# Patient Record
Sex: Female | Born: 1970 | Hispanic: Yes | Marital: Married | Smoking: Never smoker
Health system: Southern US, Community
[De-identification: ages and names within clinical notes are randomized; demographics above are authoritative.]

## PROBLEM LIST (undated history)

## (undated) DIAGNOSIS — I10 Essential (primary) hypertension: Secondary | ICD-10-CM

## (undated) DIAGNOSIS — R0602 Shortness of breath: Secondary | ICD-10-CM

## (undated) DIAGNOSIS — D649 Anemia, unspecified: Secondary | ICD-10-CM

## (undated) HISTORY — PX: TUBAL LIGATION: SHX77

---

## 2006-11-06 ENCOUNTER — Emergency Department (HOSPITAL_COMMUNITY): Admission: EM | Admit: 2006-11-06 | Discharge: 2006-11-06 | Payer: Self-pay | Admitting: Emergency Medicine

## 2008-09-02 ENCOUNTER — Emergency Department (HOSPITAL_COMMUNITY): Admission: EM | Admit: 2008-09-02 | Discharge: 2008-09-02 | Payer: Self-pay | Admitting: Emergency Medicine

## 2008-09-18 ENCOUNTER — Encounter (INDEPENDENT_AMBULATORY_CARE_PROVIDER_SITE_OTHER): Payer: Self-pay | Admitting: *Deleted

## 2009-02-17 ENCOUNTER — Ambulatory Visit (HOSPITAL_COMMUNITY): Admission: RE | Admit: 2009-02-17 | Discharge: 2009-02-17 | Payer: Self-pay | Admitting: Family Medicine

## 2009-03-15 ENCOUNTER — Encounter (INDEPENDENT_AMBULATORY_CARE_PROVIDER_SITE_OTHER): Payer: Self-pay | Admitting: *Deleted

## 2009-03-15 ENCOUNTER — Telehealth (INDEPENDENT_AMBULATORY_CARE_PROVIDER_SITE_OTHER): Payer: Self-pay | Admitting: *Deleted

## 2009-04-01 ENCOUNTER — Encounter (INDEPENDENT_AMBULATORY_CARE_PROVIDER_SITE_OTHER): Payer: Self-pay | Admitting: *Deleted

## 2010-03-22 NOTE — Progress Notes (Signed)
  Phone Note Outgoing Call   Call placed by: Ricard Dillon Call placed to: [facility] Summary of Call: RECIEVED REFERRAL ON 03/12/09. DRS OFFICE STATED THIS WAS 2ND REFERALL AND PATIENT NEVER HEARD FROM [place]. PT HAD APPT ON 11/06/08 AND WAS A NO SHOW. I CALLED PT TODAY WAS TOLD SHE WAS NOT THERE AND PERSON ANSWERING PHONE WOULD NOT TAKE MESSAGE AND HUNG UP ON ME. 03/15/09 Initial call taken by: Diana Eves,  March 26, 2009 4:41 PM

## 2010-03-22 NOTE — Letter (Signed)
Summary: Appointment Reminder  [facility]  [address]   [place], [place] [address]   Phone: [phone]  Fax: [phone]       March 15, 2009   Melanie Boyd [address] [place], [place]  [address] 07-23-1970    Dear Melanie Boyd,  We have been unable to reach you by phone to schedule a follow up   appointment that was recommended for you by Dr. Darrick Penna. It is very   important that we reach you to schedule an appointment. We hope that you  allow [place] to participate in your health care needs. Please contact us at  [phone] at your earliest convenience to schedule your appointment.  Sincerely,    [facility] R. Roetta Sessions, M.D.    Kassie Mends, M.D. Lorenza Burton, FNP-BC    Tana Coast, PA-C Phone: [phone]    Fax: [phone]

## 2010-03-22 NOTE — Letter (Signed)
Summary: Generic Letter, Intro to Referring  [facility]  [address]   [place], [place] [address]   Phone: [phone]  Fax: [phone]      April 01, 2009             RE: Melanie Boyd   August 29, 1970                 [address]                 [place], [place]  [address]  Dear,Appt Scheduler    You referred this patient to our office for a consult. We have tried to reach Melanie Boyd by phone and mail. He has failed to contact us to schedule an appointment.   Sincerely,  [facility] Ph: [phone]   Fax: [phone]

## 2010-05-30 LAB — URINE MICROSCOPIC-ADD ON

## 2010-05-30 LAB — DIFFERENTIAL
Basophils Absolute: 0 10*3/uL (ref 0.0–0.1)
Basophils Relative: 0 % (ref 0–1)
Eosinophils Absolute: 0.2 10*3/uL (ref 0.0–0.7)
Monocytes Absolute: 0.7 10*3/uL (ref 0.1–1.0)
Monocytes Relative: 6 % (ref 3–12)
Neutrophils Relative %: 68 % (ref 43–77)

## 2010-05-30 LAB — URINALYSIS, ROUTINE W REFLEX MICROSCOPIC
Bilirubin Urine: NEGATIVE
Glucose, UA: NEGATIVE mg/dL
Specific Gravity, Urine: >1.03 — ABNORMAL HIGH (ref 1.005–1.030)
Urobilinogen, UA: 0.2 mg/dL (ref 0.0–1.0)
pH: 6 (ref 5.0–8.0)

## 2010-05-30 LAB — BASIC METABOLIC PANEL
CO2: 25 mEq/L (ref 19–32)
Calcium: 9.4 mg/dL (ref 8.4–10.5)
Chloride: 106 mEq/L (ref 96–112)
Creatinine, Ser: 0.64 mg/dL (ref 0.4–1.2)
GFR calc Af Amer: >60 mL/min (ref >60)
Glucose, Bld: 142 mg/dL — ABNORMAL HIGH (ref 70–99)

## 2010-05-30 LAB — URINE CULTURE: Colony Count: 50000

## 2010-05-30 LAB — CBC
MCHC: 34.8 g/dL (ref 30.0–36.0)
MCV: 76.7 fL — ABNORMAL LOW (ref 78.0–100.0)
RBC: 4.41 MIL/uL (ref 3.87–5.11)
RDW: 14.5 % (ref 11.5–15.5)

## 2010-12-03 ENCOUNTER — Encounter: Payer: Self-pay | Admitting: *Deleted

## 2010-12-03 ENCOUNTER — Inpatient Hospital Stay (HOSPITAL_COMMUNITY)
Admission: EM | Admit: 2010-12-03 | Discharge: 2010-12-05 | DRG: 203 | Disposition: A | Discharge status: Home / Self Care | Payer: 59 | Attending: Internal Medicine | Admitting: Internal Medicine

## 2010-12-03 ENCOUNTER — Emergency Department (HOSPITAL_COMMUNITY): Payer: 59

## 2010-12-03 DIAGNOSIS — J45901 Unspecified asthma with (acute) exacerbation: Secondary | ICD-10-CM

## 2010-12-03 DIAGNOSIS — J45909 Unspecified asthma, uncomplicated: Principal | ICD-10-CM | POA: Diagnosis present

## 2010-12-03 DIAGNOSIS — D649 Anemia, unspecified: Secondary | ICD-10-CM | POA: Diagnosis present

## 2010-12-03 DIAGNOSIS — R7309 Other abnormal glucose: Secondary | ICD-10-CM | POA: Diagnosis present

## 2010-12-03 DIAGNOSIS — J40 Bronchitis, not specified as acute or chronic: Secondary | ICD-10-CM

## 2010-12-03 DIAGNOSIS — J209 Acute bronchitis, unspecified: Secondary | ICD-10-CM | POA: Diagnosis present

## 2010-12-03 DIAGNOSIS — R739 Hyperglycemia, unspecified: Secondary | ICD-10-CM

## 2010-12-03 HISTORY — DX: Shortness of breath: R06.02

## 2010-12-03 LAB — CBC
MCH: 24.4 pg — ABNORMAL LOW (ref 26.0–34.0)
MCHC: 32.7 g/dL (ref 30.0–36.0)
Platelets: 266 10*3/uL (ref 150–400)

## 2010-12-03 LAB — COMPREHENSIVE METABOLIC PANEL
ALT: 12 U/L (ref 0–35)
AST: 11 U/L (ref 0–37)
Albumin: 3.8 g/dL (ref 3.5–5.2)
Calcium: 9.3 mg/dL (ref 8.4–10.5)
Potassium: 3.6 mEq/L (ref 3.5–5.1)
Sodium: 137 mEq/L (ref 135–145)
Total Protein: 7.6 g/dL (ref 6.0–8.3)

## 2010-12-03 MED ORDER — IPRATROPIUM BROMIDE 0.02 % IN SOLN
0.5000 mg | Freq: Four times a day (QID) | RESPIRATORY_TRACT | Status: DC
  Administered 2010-12-03 – 2010-12-05 (×7): 0.5 mg via RESPIRATORY_TRACT
  Filled 2010-12-03 (×7): qty 2.5

## 2010-12-03 MED ORDER — SENNA 8.6 MG PO TABS
2.0000 | ORAL_TABLET | Freq: Every day | ORAL | Status: DC | PRN
  Filled 2010-12-03: qty 2

## 2010-12-03 MED ORDER — ENOXAPARIN SODIUM 40 MG/0.4ML ~~LOC~~ SOLN
40.0000 mg | SUBCUTANEOUS | Status: DC
  Administered 2010-12-04: 40 mg via SUBCUTANEOUS
  Filled 2010-12-03: qty 0.4

## 2010-12-03 MED ORDER — IPRATROPIUM BROMIDE 0.02 % IN SOLN
0.5000 mg | Freq: Once | RESPIRATORY_TRACT | Status: AC
  Administered 2010-12-03: 0.5 mg via RESPIRATORY_TRACT
  Filled 2010-12-03: qty 2.5

## 2010-12-03 MED ORDER — SODIUM CHLORIDE 0.9 % IV SOLN
INTRAVENOUS | Status: DC
  Administered 2010-12-03: 16:00:00 via INTRAVENOUS

## 2010-12-03 MED ORDER — ALBUTEROL SULFATE (5 MG/ML) 0.5% IN NEBU
2.5000 mg | INHALATION_SOLUTION | Freq: Once | RESPIRATORY_TRACT | Status: AC
  Administered 2010-12-03: 2.5 mg via RESPIRATORY_TRACT
  Filled 2010-12-03: qty 1

## 2010-12-03 MED ORDER — SODIUM CHLORIDE 0.9 % IV BOLUS (SEPSIS)
750.0000 mL | Freq: Once | INTRAVENOUS | Status: AC
  Administered 2010-12-03: 1000 mL via INTRAVENOUS

## 2010-12-03 MED ORDER — SODIUM CHLORIDE 0.9 % IJ SOLN
INTRAMUSCULAR | Status: AC
  Filled 2010-12-03: qty 3

## 2010-12-03 MED ORDER — ACETAMINOPHEN 325 MG PO TABS
650.0000 mg | ORAL_TABLET | Freq: Four times a day (QID) | ORAL | Status: DC | PRN
  Administered 2010-12-03: 650 mg via ORAL
  Filled 2010-12-03: qty 2

## 2010-12-03 MED ORDER — ALBUTEROL SULFATE (5 MG/ML) 0.5% IN NEBU
2.5000 mg | INHALATION_SOLUTION | Freq: Four times a day (QID) | RESPIRATORY_TRACT | Status: DC
  Administered 2010-12-03 – 2010-12-05 (×8): 2.5 mg via RESPIRATORY_TRACT
  Filled 2010-12-03 (×8): qty 0.5

## 2010-12-03 MED ORDER — ALBUTEROL SULFATE (5 MG/ML) 0.5% IN NEBU
2.5000 mg | INHALATION_SOLUTION | Freq: Once | RESPIRATORY_TRACT | Status: AC
  Administered 2010-12-03: 2.5 mg via RESPIRATORY_TRACT

## 2010-12-03 MED ORDER — DEXTROSE 5 % IV SOLN
1.0000 g | Freq: Once | INTRAVENOUS | Status: AC
  Administered 2010-12-03: 1 g via INTRAVENOUS
  Filled 2010-12-03: qty 1

## 2010-12-03 MED ORDER — ZOLPIDEM TARTRATE 5 MG PO TABS: 5.0000 mg | ORAL_TABLET | Freq: Every evening | ORAL | Status: DC | PRN

## 2010-12-03 MED ORDER — OXYCODONE HCL 5 MG PO TABS
5.0000 mg | ORAL_TABLET | ORAL | Status: DC | PRN
  Administered 2010-12-04 – 2010-12-05 (×4): 5 mg via ORAL
  Filled 2010-12-03 (×4): qty 1

## 2010-12-03 MED ORDER — ALBUTEROL SULFATE (5 MG/ML) 0.5% IN NEBU: 2.5000 mg | INHALATION_SOLUTION | RESPIRATORY_TRACT | Status: DC | PRN

## 2010-12-03 MED ORDER — ALBUTEROL (5 MG/ML) CONTINUOUS INHALATION SOLN
15.0000 mg/h | INHALATION_SOLUTION | RESPIRATORY_TRACT | Status: DC
  Administered 2010-12-03: 15 mg/h via RESPIRATORY_TRACT

## 2010-12-03 MED ORDER — ALUM & MAG HYDROXIDE-SIMETH 200-200-20 MG/5ML PO SUSP: 30.0000 mL | Freq: Four times a day (QID) | ORAL | Status: DC | PRN

## 2010-12-03 MED ORDER — SODIUM CHLORIDE 0.9 % IJ SOLN
3.0000 mL | INTRAMUSCULAR | Status: DC | PRN
  Administered 2010-12-05: 3 mL via INTRAVENOUS
  Filled 2010-12-03 (×3): qty 3

## 2010-12-03 MED ORDER — FLUTICASONE PROPIONATE HFA 220 MCG/ACT IN AERO
2.0000 | INHALATION_SPRAY | Freq: Two times a day (BID) | RESPIRATORY_TRACT | Status: DC
  Administered 2010-12-04 – 2010-12-05 (×2): 2 via RESPIRATORY_TRACT
  Filled 2010-12-03: qty 12

## 2010-12-03 MED ORDER — ONDANSETRON HCL 4 MG PO TABS: 4.0000 mg | ORAL_TABLET | Freq: Four times a day (QID) | ORAL | Status: DC | PRN

## 2010-12-03 MED ORDER — ACETAMINOPHEN 650 MG RE SUPP: 650.0000 mg | Freq: Four times a day (QID) | RECTAL | Status: DC | PRN

## 2010-12-03 MED ORDER — PREDNISONE 20 MG PO TABS
30.0000 mg | ORAL_TABLET | Freq: Two times a day (BID) | ORAL | Status: DC
  Filled 2010-12-03: qty 1

## 2010-12-03 MED ORDER — PREDNISONE 20 MG PO TABS
60.0000 mg | ORAL_TABLET | Freq: Once | ORAL | Status: AC
  Administered 2010-12-03: 60 mg via ORAL
  Filled 2010-12-03: qty 3

## 2010-12-03 MED ORDER — ONDANSETRON HCL 4 MG/2ML IJ SOLN: 4.0000 mg | Freq: Four times a day (QID) | INTRAMUSCULAR | Status: DC | PRN

## 2010-12-03 MED ORDER — ALBUTEROL SULFATE (5 MG/ML) 0.5% IN NEBU
2.5000 mg | INHALATION_SOLUTION | Freq: Once | RESPIRATORY_TRACT | Status: AC
  Administered 2010-12-03: 2.5 mg via RESPIRATORY_TRACT
  Filled 2010-12-03: qty 0.5

## 2010-12-03 NOTE — ED Provider Notes (Signed)
Results for orders placed during the hospital encounter of 12/03/10  CBC      Component Value Range   WBC 10.0  4.0 - 10.5 (K/uL)   RBC 4.63  3.87 - 5.11 (MIL/uL)   Hemoglobin 11.3 (*) 12.0 - 15.0 (g/dL)   HCT 16.1 (*) 09.6 - 46.0 (%)   MCV 74.7 (*) 78.0 - 100.0 (fL)   MCH 24.4 (*) 26.0 - 34.0 (pg)   MCHC 32.7  30.0 - 36.0 (g/dL)   RDW 04.5  40.9 - 81.1 (%)   Platelets 266  150 - 400 (K/uL)  COMPREHENSIVE METABOLIC PANEL      Component Value Range   Sodium 137  135 - 145 (mEq/L)   Potassium 3.6  3.5 - 5.1 (mEq/L)   Chloride 101  96 - 112 (mEq/L)   CO2 25  19 - 32 (mEq/L)   Glucose, Bld 131 (*) 70 - 99 (mg/dL)   BUN 7  6 - 23 (mg/dL)   Creatinine, Ser <9.14 (*) 0.50 - 1.10 (mg/dL)   Calcium 9.3  8.4 - 78.2 (mg/dL)   Total Protein 7.6  6.0 - 8.3 (g/dL)   Albumin 3.8  3.5 - 5.2 (g/dL)   AST 11  0 - 37 (U/L)   ALT 12  0 - 35 (U/L)   Alkaline Phosphatase 70  39 - 117 (U/L)   Total Bilirubin 0.2 (*) 0.3 - 1.2 (mg/dL)   GFR calc non Af Amer NOT CALCULATED  >90 (mL/min)   GFR calc Af Amer NOT CALCULATED  >90 (mL/min)   Dg Chest 2 View  12/03/2010  *RADIOLOGY REPORT*  Clinical Data: Shortness of breath  CHEST - 2 VIEW  Comparison: 02/17/2009  Findings:  Mild to moderate cardiac enlargement, stable.  No pleural effusion or pulmonary edema.  No airspace consolidation identified.  IMPRESSION:  1.  No active disease. 2.  Cardiac enlargement.  Original Report Authenticated By: Rosealee Albee, M.D.   Devoria Albe, MD, Armando Gang    Ward Givens, MD 12/03/10 [phone]

## 2010-12-03 NOTE — ED Provider Notes (Signed)
History     CSN: [id number] Arrival date & time: 12/03/2010 10:56 AM  Chief Complaint  Patient presents with  . Shortness of Breath    (Consider location/radiation/quality/duration/timing/severity/associated sxs/prior treatment) HPI  Patient states Thursday 2 days ago she started having some sharp pain in her left central chest in lost her voice and had a sore throat yesterday she started coughing with yellow mucus production. She states she has also started wheezing and had some shortness of breath. She states she's never had this before except the last year after she got her flu shot she did have to come to the emergency department at 1 breathing treatment. She denies nausea or but did have one episode of posttussive vomiting today she denies diarrhea. She has had clear watery rhinorrhea. She relates she has dyspnea on exertion. Nothing she does makes it feel better.   History reviewed. No pertinent past medical history.  Past Surgical History  Procedure Date  . Tubal ligation     Family history significant for reactive airway disease in 2 siblings.  History  Substance Use Topics  . Smoking status: Never Smoker   . Smokeless tobacco: Not on file  . Alcohol Use: No   patient lives with spouse. 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  OB History    Grav Para Term Preterm Abortions TAB SAB Ect Mult Living                  Review of Systems  All other systems reviewed and are negative.    Allergies  Review of patient's allergies indicates no known allergies.  Home Medications  No current outpatient prescriptions on file.  BP 110/74  Pulse 79  Temp(Src) 98.2 F (36.8 C) (Oral)  Resp 24  Ht 5\' 1"  (1.549 m)  Wt 200 lb (90.719 kg)  BMI 37.79 kg/m2  SpO2 96%  LMP 11/07/2010  Vital signs are normal. Physical Exam  Vitals reviewed. Constitutional: She appears well-developed and well-nourished.  HENT:  Head: Normocephalic and atraumatic.    Mouth/Throat: Uvula is midline, oropharynx is clear and moist and mucous membranes are normal.  Eyes: Conjunctivae and EOM are normal. Pupils are equal, round, and reactive to light.  Cardiovascular: Normal rate, regular rhythm and normal heart sounds.   Pulmonary/Chest:       Patient is noted to have mild retractions she appears to be tachypneic she has diffuse expiratory wheezes and rhonchi. At times she has audible wheezing.  Musculoskeletal: Normal range of motion.  Neurological: She has normal strength. No cranial nerve deficit or sensory deficit.  Skin: Skin is warm, dry and intact.  Psychiatric: Her speech is normal and behavior is normal. Her affect is blunt.    ED Course  Procedures (including critical care time)  Labs Reviewed - No data to display Dg Chest 2 View  12/03/2010  *RADIOLOGY REPORT*  Clinical Data: Shortness of breath  CHEST - 2 VIEW  Comparison: 02/17/2009  Findings:  Mild to moderate cardiac enlargement, stable.  No pleural effusion or pulmonary edema.  No airspace consolidation identified.  IMPRESSION:  1.  No active disease. 2.  Cardiac enlargement.  Original Report Authenticated By: Rosealee Albee, M.D.   12:50 Patient received albuterol Atrovent nebulizer she states she's feeling better. She now has diffuse wheezing and rhonchi that has improved air movement. We will repeat her nebulizer.  13:40 After her second nebulizer she was feeling better but states she is getting worse again. Pt  has diffuse wheezing and has some tachypnea. Will try continuous nebulizer and start on oral steroids.   15:09 Checked after her continuous nebulizer. States she feels better however when she starts talking she becomes tachypneic. Has diffuse expiratory wheezing but improved. Have discussed she needs to be admitted and she is agreeable.   1617 IV started, patient started on IV antibiotics. Waiting for labs to get admitted.   16:34 Dr. Jordan Hawks he is going to admit to try 18 to  MedSurg bed   CRITICAL CARE Performed by: Makailah Slavick L   Total critical care time: 32  Critical care time was exclusive of separately billable procedures and treating other patients.  Critical care was necessary to treat or prevent imminent or life-threatening deterioration.  Critical care was time spent personally by me on the following activities: development of treatment plan with patient and/or surrogate as well as nursing, discussions with consultants, evaluation of patient's response to treatment, examination of patient, obtaining history from patient or surrogate, ordering and performing treatments and interventions, ordering and review of laboratory studies, ordering and review of radiographic studies, pulse oximetry and re-evaluation of patient's condition.  Devoria Albe, MD, FACEP  MDM          Ward Givens, MD 12/03/10 [phone]

## 2010-12-03 NOTE — ED Notes (Signed)
Pt c/o difficulty breathing, cough, congestion, yellow phlegm, chest pain with cough, and wheezing. Vomited x 1 this am.

## 2010-12-03 NOTE — H&P (Addendum)
PCP:   Dr. Phillips Odor   Chief Complaint:  Cough and shortness of breath  HPI:  40 year old female with no significant past medical history presenting to the emergency department due to shortness of breath and cough for the last 2 days. The patient has no history of asthma however reports that every time she gets the flu shot she develops cough and shortness of breath. The first time was last year when she had the flu shot and subsequently developed upper respiratory symptoms for 9 days. She did not require hospitalization. This time she received the influenza vaccine on October 3 and develop cough and shortness of breath 2 days ago. She had to miss work today. She has not been eating for the last 36 hours. She has been drinking plenty of fluids. She denies fever chills or night sweats. She has no history of tuberculosis or TB exposure. [place] is [place] There are no sick contacts at home. In the emergency department she received oxygen, prednisone 60 mg, and continuous nebulization with albuterol with partial improvement. She still feels very tight and continues to cough. She has no sputum production.   Allergies:  No Known Allergies   History reviewed. No pertinent past medical history.  Past Surgical History  Procedure Date  . Tubal ligation     Prior to Admission medications   Medication Sig Start Date End Date Taking? Authorizing Provider  ibuprofen (ADVIL,MOTRIN) 200 MG tablet Take 200-400 mg by mouth every 6 (six) hours as needed. For headaches    Yes Historical Provider, MD    Social History:  reports that she has never smoked. She does not have any smokeless tobacco history on file. She reports that she does not drink alcohol or use illicit drugs.the patient came to the [place] 27 years ago. 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 She is married and has 4 children all of whom  are healthy.   Family History  Problem Relation Age of Onset  . Asthma Sister   . Hypertension Mother   . Asthma Sister     Review of Systems:  Constitutional: Denies fever, chills, diaphoresis, appetite change and fatigue.  HEENT: Denies photophobia, eye pain, redness, hearing loss, ear pain, congestion, sore throat, rhinorrhea, sneezing, mouth sores, trouble swallowing, neck pain, neck stiffness and tinnitus.   Respiratory:  nonproductive cough and shortness of breath   Cardiovascular:  retrosternal chest pain with deep inspiration denies palpitations, edema or syncope  Gastrointestinal: Denies nausea, vomiting, abdominal pain, diarrhea, constipation, blood in stool and abdominal distention.  Genitourinary: Denies dysuria, urgency, frequency, hematuria, flank pain and difficulty urinating.  last menstrual period 11/05/2010  Musculoskeletal: Denies myalgias, back pain, joint swelling, arthralgias and gait problem.  Skin: Denies pallor, rash and wound.  Neurological: Denies dizziness, seizures, syncope, weakness, light-headedness, numbness   Physical Exam: Blood pressure 110/74, pulse 79, temperature 98.2 F (36.8 C), temperature source Oral, resp. rate 24, height 5\' 1"  (1.549 m), weight 90.719 kg (200 lb), last menstrual period 11/07/2010, SpO2 96.00%.  Constitutional: Vital signs reviewed.  Patient is a well-developed and well-nourished, overweight hispanic woman in no acute distress and cooperative with exam. Alert and oriented x3.  Head: Normocephalic and atraumatic Ear: TM normal bilaterally Mouth: no erythema or exudates, MMM Eyes: PERRL, EOMI, conjunctivae normal, No scleral icterus.  Neck: Supple, Trachea midline normal ROM, No JVD, mass, thyromegaly, or carotid  bruit present.  Cardiovascular: RRR, S1 normal, S2 normal, no MRG, pulses symmetric and intact bilaterally Pulmonary/Chest: symmetric, no use of accesory muscles, distant expiratory wheezes, a few ronchi, no  rales Abdominal: Soft. Non-tender, non-distended, bowel sounds are normal, no masses, organomegaly, or guarding present. Neurological: A&O x3, Strenght is normal and symmetric bilaterally, cranial nerve II-XII are grossly intact, no focal motor deficit, sensory intact to light touch bilaterally.  Skin: Warm, dry and intact. No rash, cyanosis, or clubbing.    Labs on Admission:  Results for orders placed during the hospital encounter of 12/03/10 (from the past 48 hour(s))  CBC     Status: Abnormal   Collection Time   12/03/10  3:23 PM      Component Value Range Comment   WBC 10.0  4.0 - 10.5 (K/uL)    RBC 4.63  3.87 - 5.11 (MIL/uL)    Hemoglobin 11.3 (*) 12.0 - 15.0 (g/dL)    HCT 62.1 (*) 30.8 - 46.0 (%)    MCV 74.7 (*) 78.0 - 100.0 (fL)    MCH 24.4 (*) 26.0 - 34.0 (pg)    MCHC 32.7  30.0 - 36.0 (g/dL)    RDW 65.7  84.6 - 96.2 (%)    Platelets 266  150 - 400 (K/uL)   COMPREHENSIVE METABOLIC PANEL     Status: Abnormal   Collection Time   12/03/10  3:23 PM      Component Value Range Comment   Sodium 137  135 - 145 (mEq/L)    Potassium 3.6  3.5 - 5.1 (mEq/L)    Chloride 101  96 - 112 (mEq/L)    CO2 25  19 - 32 (mEq/L)    Glucose, Bld 131 (*) 70 - 99 (mg/dL)    BUN 7  6 - 23 (mg/dL)    Creatinine, Ser <9.52 (*) 0.50 - 1.10 (mg/dL)    Calcium 9.3  8.4 - 10.5 (mg/dL)    Total Protein 7.6  6.0 - 8.3 (g/dL)    Albumin 3.8  3.5 - 5.2 (g/dL)    AST 11  0 - 37 (U/L)    ALT 12  0 - 35 (U/L)    Alkaline Phosphatase 70  39 - 117 (U/L)    Total Bilirubin 0.2 (*) 0.3 - 1.2 (mg/dL)    GFR calc non Af Amer NOT CALCULATED  >90 (mL/min)    GFR calc Af Amer NOT CALCULATED  >90 (mL/min)     Radiological Exams on Admission: Chest x-ray: IMPRESSION:  1. No active disease.  2. Cardiac enlargement.   Assessment/Plan Active Problems:  Asthma attack. Since the patient has responded only partially to treatment given emergent department, she will be admitted to MedSurg. To continue albuterol and  Atrovent along with prednisone 60 mg a day and oxygen supplementation. In addition she will be initiated on inhaled corticosteroid with Flovent 220 mcg twice a day. Discontinue Rocephin  Anemia. She has a microcytic anemia with no active bleeding which is mild. This is to be addressed as an outpatient. Patient was informed of this finding and advised to follow up with her regular physician  Hyperglycemia. Hyperglycemia which is mild and postprandial. Again, this is to be addressed as an outpatient and I have told patient to undergo screening for diabetes as an outpatient when she is completely off systemic corticosteroids   Time Spent on Admission: 30 minutes  Jonny Ruiz 12/03/2010, 5:05 PM

## 2010-12-04 MED ORDER — AZITHROMYCIN 250 MG PO TABS
250.0000 mg | ORAL_TABLET | Freq: Every day | ORAL | Status: DC
  Administered 2010-12-05: 250 mg via ORAL
  Filled 2010-12-04: qty 1

## 2010-12-04 MED ORDER — AZITHROMYCIN 250 MG PO TABS
500.0000 mg | ORAL_TABLET | Freq: Every day | ORAL | Status: AC
  Administered 2010-12-04: 500 mg via ORAL
  Filled 2010-12-04: qty 2

## 2010-12-04 MED ORDER — GUAIFENESIN-DM 100-10 MG/5ML PO SYRP
5.0000 mL | ORAL_SOLUTION | ORAL | Status: DC | PRN
  Administered 2010-12-04 (×2): 5 mL via ORAL
  Filled 2010-12-04 (×2): qty 5

## 2010-12-04 MED ORDER — GUAIFENESIN ER 600 MG PO TB12
600.0000 mg | ORAL_TABLET | Freq: Two times a day (BID) | ORAL | Status: DC
  Administered 2010-12-04 – 2010-12-05 (×5): 600 mg via ORAL
  Filled 2010-12-04 (×3): qty 1

## 2010-12-04 MED ORDER — METHYLPREDNISOLONE SODIUM SUCC 40 MG IJ SOLR
60.0000 mg | Freq: Four times a day (QID) | INTRAMUSCULAR | Status: DC
  Administered 2010-12-04 – 2010-12-05 (×5): 60 mg via INTRAVENOUS
  Filled 2010-12-04: qty 1
  Filled 2010-12-04 (×4): qty 2

## 2010-12-04 NOTE — Progress Notes (Signed)
Pt needs to go home with an albuterol inhaler with spacer

## 2010-12-04 NOTE — Progress Notes (Signed)
Subjective: Feeling better today, able to talk, still coughing and wheezing  Objective: Vital signs in last 24 hours: Temp:  [98 F (36.7 C)-98.5 F (36.9 C)] 98 F (36.7 C) (10/14 0625) Pulse Rate:  [74-93] 78  (10/14 0625) Resp:  [18-24] 18  (10/14 0625) BP: (110-162)/(67-90) 162/75 mmHg (10/14 0625) SpO2:  [93 %-100 %] 94 % (10/14 0714) Weight:  [90.7 kg (199 lb 15.3 oz)-90.719 kg (200 lb)] 199 lb 15.3 oz (90.7 kg) (10/13 1840) Weight change:  Last BM Date: 12/01/10  Intake/Output from previous day:       Physical Exam: General: Alert, awake, oriented x3, in no acute distress. HEENT: No bruits, no goiter. Heart: S1, S2 tachycardia, without murmurs, rubs, gallops. Lungs: bilateral exp wheezes Abdomen: Soft, nontender, nondistended, positive bowel sounds. Extremities: No clubbing cyanosis or edema with positive pedal pulses. Neuro: Grossly intact, nonfocal.    Lab Results: Basic Metabolic Panel:  Basename 12/03/10 1523  NA 137  K 3.6  CL 101  CO2 25  GLUCOSE 131*  BUN 7  CREATININE <0.47*  CALCIUM 9.3  MG --  PHOS --   Liver Function Tests:  Basename 12/03/10 1523  AST 11  ALT 12  ALKPHOS 70  BILITOT 0.2*  PROT 7.6  ALBUMIN 3.8   No results found for this basename: LIPASE:2,AMYLASE:2 in the last 72 hours No results found for this basename: AMMONIA:2 in the last 72 hours CBC:  Basename 12/03/10 1523  WBC 10.0  NEUTROABS --  HGB 11.3*  HCT 34.6*  MCV 74.7*  PLT 266   Cardiac Enzymes: No results found for this basename: CKTOTAL:3,CKMB:3,CKMBINDEX:3,TROPONINI:3 in the last 72 hours BNP: No results found for this basename: POCBNP:3 in the last 72 hours D-Dimer: No results found for this basename: DDIMER:2 in the last 72 hours CBG: No results found for this basename: GLUCAP:6 in the last 72 hours Hemoglobin A1C: No results found for this basename: HGBA1C in the last 72 hours Fasting Lipid Panel: No results found for this basename:  CHOL,HDL,LDLCALC,TRIG,CHOLHDL,LDLDIRECT in the last 72 hours Thyroid Function Tests: No results found for this basename: TSH,T4TOTAL,FREET4,T3FREE,THYROIDAB in the last 72 hours Anemia Panel: No results found for this basename: VITAMINB12,FOLATE,FERRITIN,TIBC,IRON,RETICCTPCT in the last 72 hours Urine Drug Screen:  Alcohol Level: No results found for this basename: ETH:2 in the last 72 hours Urinalysis:  Misc. Labs:  No results found for this or any previous visit (from the past 240 hour(s)).  Studies/Results: Dg Chest 2 View  12/03/2010  *RADIOLOGY REPORT*  Clinical Data: Shortness of breath  CHEST - 2 VIEW  Comparison: 02/17/2009  Findings:  Mild to moderate cardiac enlargement, stable.  No pleural effusion or pulmonary edema.  No airspace consolidation identified.  IMPRESSION:  1.  No active disease. 2.  Cardiac enlargement.  Original Report Authenticated By: Rosealee Albee, M.D.    Medications: Scheduled Meds:   . albuterol  2.5 mg Nebulization Once  . albuterol  2.5 mg Nebulization Once  . albuterol  2.5 mg Nebulization Once  . albuterol  2.5 mg Nebulization Once  . albuterol  2.5 mg Nebulization Q6H  . cefTRIAXone (ROCEPHIN) IVPB 1 gram/50 mL D5W  1 g Intravenous Once  . enoxaparin  40 mg Subcutaneous Q24H  . fluticasone  2 puff Inhalation BID  . ipratropium  0.5 mg Nebulization Once  . ipratropium  0.5 mg Nebulization Once  . ipratropium  0.5 mg Nebulization Q6H  . predniSONE  30 mg Oral BID  . predniSONE  60  mg Oral Once  . sodium chloride  750 mL Intravenous Once  . sodium chloride       Continuous Infusions:   . sodium chloride 100 mL/hr at 12/03/10 1558  . albuterol 15 mg/hr (12/03/10 1405)   PRN Meds:.acetaminophen, acetaminophen, albuterol, alum & mag hydroxide-simeth, ondansetron (ZOFRAN) IV, ondansetron, oxyCODONE, senna, sodium chloride, zolpidem  Assessment/Plan:  Principal Problem:  *Asthma attack Still actively wheezing with increased work of  breathing Will add azithromcyin Change prednisone to solumedrol today Cont pulmonary hygiene ambulate  Active Problems:  Anemia Stable, for outpt follow up   Hyperglycemia Stable, for outpt follow up with pmd for diabetes screening once off steroids  Dispo Possibly home tomorrow if breathing continues to improve     LOS: 1 day   Brnadon Eoff 12/04/2010, 9:17 AM

## 2010-12-05 DIAGNOSIS — J209 Acute bronchitis, unspecified: Secondary | ICD-10-CM | POA: Diagnosis present

## 2010-12-05 LAB — CBC
MCH: 24.8 pg — ABNORMAL LOW (ref 26.0–34.0)
MCHC: 33 g/dL (ref 30.0–36.0)
Platelets: 285 10*3/uL (ref 150–400)
RBC: 4.28 MIL/uL (ref 3.87–5.11)

## 2010-12-05 LAB — BASIC METABOLIC PANEL
Calcium: 9.3 mg/dL (ref 8.4–10.5)
Sodium: 136 mEq/L (ref 135–145)

## 2010-12-05 MED ORDER — FLUTICASONE PROPIONATE HFA 220 MCG/ACT IN AERO: 2.0000 | INHALATION_SPRAY | Freq: Two times a day (BID) | RESPIRATORY_TRACT | Status: DC

## 2010-12-05 MED ORDER — SODIUM CHLORIDE 0.9 % IN NEBU
INHALATION_SOLUTION | RESPIRATORY_TRACT | Status: AC
  Administered 2010-12-05: 3 mL
  Filled 2010-12-05: qty 3

## 2010-12-05 MED ORDER — ALBUTEROL SULFATE (5 MG/ML) 0.5% IN NEBU: 2.5000 mg | INHALATION_SOLUTION | Freq: Four times a day (QID) | RESPIRATORY_TRACT | Status: DC

## 2010-12-05 MED ORDER — GUAIFENESIN ER 600 MG PO TB12: 600.0000 mg | ORAL_TABLET | Freq: Two times a day (BID) | ORAL | Status: AC

## 2010-12-05 MED ORDER — PREDNISONE (PAK) 10 MG PO TABS: 20.0000 mg | ORAL_TABLET | Freq: Every day | ORAL | Status: DC

## 2010-12-05 MED ORDER — RACEPINEPHRINE HCL 2.25 % IN NEBU
INHALATION_SOLUTION | RESPIRATORY_TRACT | Status: AC
  Filled 2010-12-05: qty 1

## 2010-12-05 MED ORDER — AZITHROMYCIN 250 MG PO TABS: ORAL_TABLET | ORAL | Status: AC

## 2010-12-05 MED ORDER — AZITHROMYCIN 250 MG PO TABS: ORAL_TABLET | ORAL | Status: DC

## 2010-12-05 MED ORDER — PREDNISONE (PAK) 10 MG PO TABS: 20.0000 mg | ORAL_TABLET | Freq: Every day | ORAL | Status: AC

## 2010-12-05 MED ORDER — GUAIFENESIN ER 600 MG PO TB12: 600.0000 mg | ORAL_TABLET | Freq: Two times a day (BID) | ORAL | Status: DC

## 2010-12-05 NOTE — Progress Notes (Signed)
Patient was given discharge instructions along with follow-  Up appointments. Patient verbalized understanding of all instructions. Patient was escorted by staff via wheelchair to vehicle. Patient discharged to home in stable condition.

## 2010-12-05 NOTE — Discharge Summary (Signed)
Physician Discharge Summary  Patient ID: Melanie Boyd MRN: [id number] DOB/AGE: [birth year] 40 y.o.  Admit date: 12/03/2010 Discharge date: 12/05/2010  Primary Care Physician:  No primary provider on file.   Discharge Diagnoses:   Principal Problem:  *Asthma attack Active Problems:  Anemia  Hyperglycemia  Bronchitis, acute   Present on Admission:  .Asthma attack .Anemia .Hyperglycemia .Bronchitis, acute   Current Discharge Medication List    START taking these medications   Details  albuterol (PROVENTIL) (5 MG/ML) 0.5% nebulizer solution Take 0.5 mLs (2.5 mg total) by nebulization every 6 (six) hours. Qty: 20 mL, Refills: 0    azithromycin (ZITHROMAX) 250 MG tablet Take one tab po daily Qty: 6 each, Refills: 0    fluticasone (FLOVENT HFA) 220 MCG/ACT inhaler Inhale 2 puffs into the lungs 2 (two) times daily. Qty: 1 Inhaler, Refills: 0    guaiFENesin (MUCINEX) 600 MG 12 hr tablet Take 1 tablet (600 mg total) by mouth 2 (two) times daily. Qty: 14 tablet, Refills: 0    predniSONE (STERAPRED UNI-PAK) 10 MG tablet Take 2 tablets (20 mg total) by mouth daily. Take 3 tabs po daily for 2 days then 2 tabs po daily for 2 days then 1.5 tabs daily for 2 days then 1 tab daily for 2 days then 0.5 tabs daily for 2 days then stop Qty: 16 tablet, Refills: 0      CONTINUE these medications which have NOT CHANGED   Details  ibuprofen (ADVIL,MOTRIN) 200 MG tablet Take 200-400 mg by mouth every 6 (six) hours as needed. For headaches          Disposition and Follow-up:  Follow up with primary doctor in 2 weeks  Consults:  none   Significant Diagnostic Studies:  Dg Chest 2 View  12/03/2010  *RADIOLOGY REPORT*  Clinical Data: Shortness of breath  CHEST - 2 VIEW  Comparison: 02/17/2009  Findings:  Mild to moderate cardiac enlargement, stable.  No pleural effusion or pulmonary edema.  No airspace consolidation identified.  IMPRESSION:  1.  No active disease. 2.  Cardiac  enlargement.  Original Report Authenticated By: Rosealee Albee, M.D.    Brief H and P: 40 year old female with no significant past medical history presenting to the emergency department due to shortness of breath and cough for the last 2 days. The patient has no history of asthma however reports that every time she gets the flu shot she develops cough and shortness of breath. The first time was last year when she had the flu shot and subsequently developed upper respiratory symptoms for 9 days. She did not require hospitalization. This time she received the influenza vaccine on October 3 and develop cough and shortness of breath 2 days ago. She had to miss work today. She has not been eating for the last 36 hours. She has been drinking plenty of fluids. She denies fever chills or night sweats. She has no history of tuberculosis or TB exposure. She is [place] There are no sick contacts at home. In the emergency department she received oxygen, prednisone 60 mg, and continuous nebulization with albuterol with partial improvement. She still feels very tight and continues to cough. She has no sputum production.      Hospital Course:    1. Asthma attack:  Patient was placed on intravenous steroids, and scheduled nebulization treatments.  She was started on azithromycin for antibiotic coverage.  Her wheezing and shortness of breath have significantly improved.  She is able to ambulate  in the halls without significant shortness of breath and on room air.  She is requesting to discharge home today.  Since she has clinically improved, this seems reasonable.  Will switch her to a prednisone taper and plan on discharge later today  2.  Anemia:  This remained stable and can be further followed up as an outpatient.  3.  Hyperglycemia, transient.  This has since improved.  She has been advised to follow up with her doctor for diabetes screening once she is off prednisone.   4. Bronchitis, acute,  improved, see above.   Time spent on Discharge: 45 mins  Signed: Rolan Wrightsman 12/05/2010, 1:02 PM

## 2011-03-08 ENCOUNTER — Other Ambulatory Visit (HOSPITAL_COMMUNITY): Payer: Self-pay | Admitting: Family Medicine

## 2011-03-08 DIAGNOSIS — R109 Unspecified abdominal pain: Secondary | ICD-10-CM

## 2011-03-10 ENCOUNTER — Ambulatory Visit (HOSPITAL_COMMUNITY)
Admission: RE | Admit: 2011-03-10 | Discharge: 2011-03-10 | Disposition: A | Discharge status: Home / Self Care | Payer: 59 | Source: Ambulatory Visit | Attending: Family Medicine | Admitting: Family Medicine

## 2011-03-10 ENCOUNTER — Other Ambulatory Visit (HOSPITAL_COMMUNITY): Payer: Self-pay | Admitting: Family Medicine

## 2011-03-10 DIAGNOSIS — R109 Unspecified abdominal pain: Secondary | ICD-10-CM

## 2011-03-10 DIAGNOSIS — N949 Unspecified condition associated with female genital organs and menstrual cycle: Secondary | ICD-10-CM | POA: Insufficient documentation

## 2012-05-23 ENCOUNTER — Other Ambulatory Visit (HOSPITAL_COMMUNITY): Payer: Self-pay | Admitting: Internal Medicine

## 2012-05-23 ENCOUNTER — Ambulatory Visit (HOSPITAL_COMMUNITY)
Admission: RE | Admit: 2012-05-23 | Discharge: 2012-05-23 | Disposition: A | Discharge status: Home / Self Care | Payer: 59 | Source: Ambulatory Visit | Attending: Internal Medicine | Admitting: Internal Medicine

## 2012-05-23 DIAGNOSIS — R112 Nausea with vomiting, unspecified: Secondary | ICD-10-CM | POA: Insufficient documentation

## 2012-05-23 DIAGNOSIS — R109 Unspecified abdominal pain: Secondary | ICD-10-CM

## 2012-05-23 DIAGNOSIS — R9389 Abnormal findings on diagnostic imaging of other specified body structures: Secondary | ICD-10-CM | POA: Insufficient documentation

## 2012-05-23 DIAGNOSIS — K7689 Other specified diseases of liver: Secondary | ICD-10-CM | POA: Insufficient documentation

## 2012-05-23 DIAGNOSIS — N831 Corpus luteum cyst of ovary, unspecified side: Secondary | ICD-10-CM | POA: Insufficient documentation

## 2012-05-23 MED ORDER — IOHEXOL 300 MG/ML  SOLN
100.0000 mL | Freq: Once | INTRAMUSCULAR | Status: AC | PRN
  Administered 2012-05-23: 100 mL via INTRAVENOUS

## 2012-05-30 ENCOUNTER — Encounter: Payer: Self-pay | Admitting: *Deleted

## 2012-05-30 DIAGNOSIS — R109 Unspecified abdominal pain: Secondary | ICD-10-CM

## 2012-05-30 DIAGNOSIS — R112 Nausea with vomiting, unspecified: Secondary | ICD-10-CM | POA: Insufficient documentation

## 2012-06-03 ENCOUNTER — Encounter: Payer: Self-pay | Admitting: Obstetrics & Gynecology

## 2012-06-03 ENCOUNTER — Ambulatory Visit (INDEPENDENT_AMBULATORY_CARE_PROVIDER_SITE_OTHER): Payer: 59 | Admitting: Obstetrics & Gynecology

## 2012-06-03 VITALS — BP 150/90 | Ht 60.0 in | Wt 219.0 lb

## 2012-06-03 DIAGNOSIS — R1031 Right lower quadrant pain: Secondary | ICD-10-CM

## 2012-06-03 DIAGNOSIS — R1032 Left lower quadrant pain: Secondary | ICD-10-CM

## 2012-06-05 NOTE — Progress Notes (Signed)
Patient ID: Melanie Boyd, female   DOB: Apr 08, 1970, 42 y.o.   MRN: [id number] Referred from [place], [place] from [place] for evaluation of abdominal pain lasting for the last 2-3 weeks.  Had a CT scan earlier this month which was essentially normal, very small intramural myoma and a small physiological corpus luteum of the left ovary.  Pt describes pain as a sharp stabby tingling pain lasts about 20 seconds or so then subsides.  States she feels it more with certain movements, like twisting or lifting.  Not associated with her menses or intercourse.  Had a similar episode 16 months ago, also had a work up which again was normal.  Patient was pretty insistent on surgery to "remove the balls" and the "fat in her abdomen"  I reviewed all of her labs and scans over the past 16 months via [facility].  Exam Reveals 2 tender spots on right lower and left mid abdomen most consistent with abdominal wall pain  I talked with patient for about 45 minutes regarding the lack of gyn pathology and patient was adamant about the specific surgery she thopught she needed.  I explained to her that there was not pathology from a gyn standpoint that was causing her pain and certainly no surgery was warranted.  I am not sure patient was in agreement with that assessment.  She said she was going right over to Dr Jake Shark office([place]) I tried to dissuade her but she was adamant.  I called and talked with Jonathon for some time regarding this encounter in case she did go to his office. I told him my thought was this was abdominal wall pain, not gyn pathology causing her pain.  Lazaro Arms 06/05/2012 9:57 PM

## 2012-06-11 ENCOUNTER — Encounter (INDEPENDENT_AMBULATORY_CARE_PROVIDER_SITE_OTHER): Payer: Self-pay | Admitting: *Deleted

## 2012-06-13 ENCOUNTER — Encounter: Payer: Self-pay | Admitting: Obstetrics & Gynecology

## 2012-06-20 ENCOUNTER — Other Ambulatory Visit (INDEPENDENT_AMBULATORY_CARE_PROVIDER_SITE_OTHER): Payer: Self-pay | Admitting: Internal Medicine

## 2012-06-20 ENCOUNTER — Encounter (INDEPENDENT_AMBULATORY_CARE_PROVIDER_SITE_OTHER): Payer: Self-pay | Admitting: Internal Medicine

## 2012-06-20 ENCOUNTER — Ambulatory Visit (INDEPENDENT_AMBULATORY_CARE_PROVIDER_SITE_OTHER): Payer: 59 | Admitting: Internal Medicine

## 2012-06-20 VITALS — BP 112/80 | HR 60 | Ht 61.0 in | Wt 215.6 lb

## 2012-06-20 DIAGNOSIS — D649 Anemia, unspecified: Secondary | ICD-10-CM

## 2012-06-20 DIAGNOSIS — N839 Noninflammatory disorder of ovary, fallopian tube and broad ligament, unspecified: Secondary | ICD-10-CM

## 2012-06-20 DIAGNOSIS — N838 Other noninflammatory disorders of ovary, fallopian tube and broad ligament: Secondary | ICD-10-CM

## 2012-06-20 LAB — CBC WITH DIFFERENTIAL/PLATELET
Basophils Absolute: 0 10*3/uL (ref 0.0–0.1)
Basophils Relative: 0 % (ref 0–1)
Eosinophils Relative: 2 % (ref 0–5)
HCT: 35.1 % — ABNORMAL LOW (ref 36.0–46.0)
Lymphocytes Relative: 25 % (ref 12–46)
MCHC: 33.9 g/dL (ref 30.0–36.0)
MCV: 74.1 fL — ABNORMAL LOW (ref 78.0–100.0)
Monocytes Absolute: 0.5 10*3/uL (ref 0.1–1.0)
Platelets: 315 10*3/uL (ref 150–400)
RDW: 16 % — ABNORMAL HIGH (ref 11.5–15.5)
WBC: 8.9 10*3/uL (ref 4.0–10.5)

## 2012-06-20 LAB — COMPREHENSIVE METABOLIC PANEL
ALT: 16 U/L (ref 0–35)
AST: 11 U/L (ref 0–37)
Alkaline Phosphatase: 69 U/L (ref 39–117)
BUN: 9 mg/dL (ref 6–23)
Chloride: 104 mEq/L (ref 96–112)
Creat: 0.58 mg/dL (ref 0.50–1.10)
Total Bilirubin: 0.3 mg/dL (ref 0.3–1.2)

## 2012-06-20 LAB — CA 125: CA 125: 10.7 U/mL (ref 0.0–30.2)

## 2012-06-20 NOTE — Progress Notes (Signed)
Subjective:     Patient ID: Melanie Boyd, female   DOB: 1970/07/11, 42 y.o.   MRN: [id number]  HPI Referred to our office by Justin Mend PA: [facility] for abdominal pain. She gives a hx of lower abdominal pain. She tells me cysts on her ovaries. She has had abdominal pain for about a month.  The pain comes and goes. When she had the pain at first she rated the pain at a 10. She tells me she feels a little better today. She does not feel as down as she did.  Saw Dr. Despina Hidden on the 14th of this month for this pain. She was requesting some type of surgery. Apparently became upset and left and saw her PCP that day.  Appetite is okay. No weight loss. Small amt of pain today. Last menstrual period: 06/12/2012. After she has her period, she usually feels better.  Her periods are usually heavy x 3-4 days. Uses approximately 34 pads during her period. Tubal ligation 14 yrs ago. She usually has a BM once a day in small amounts.  No pain with her BMs No family hx of cancer.     05/23/2012 CT abdomen/pelvis with CM:  IMPRESSION:  1. Left ovarian corpus luteal cyst.  2. At least one uterine fibroid suspected, suboptimally evaluated.  Please see 03/10/2011 report.  3. No other explanation for pain; normal appendix.  4. Hepatic steatosis.  CBC    Component Value Date/Time   WBC 14.8* 12/05/2010 0453   RBC 4.28 12/05/2010 0453   HGB 10.6* 12/05/2010 0453   HCT 32.1* 12/05/2010 0453   PLT 285 12/05/2010 0453   MCV 75.0* 12/05/2010 0453   MCH 24.8* 12/05/2010 0453   MCHC 33.0 12/05/2010 0453   RDW 15.7* 12/05/2010 0453   LYMPHSABS 2.8 09/02/2008 1825   MONOABS 0.7 09/02/2008 1825   EOSABS 0.2 09/02/2008 1825   BASOSABS 0.0 09/02/2008 1825      Review of Systems     Current Outpatient Prescriptions  Medication Sig Dispense Refill  . gabapentin (NEURONTIN) 300 MG capsule Take 300 mg by mouth 3 (three) times daily.      . IRON PO Take 65 mg by mouth every other day.      .  traMADol-acetaminophen (ULTRACET) 37.5-325 MG per tablet Take 1 tablet by mouth 3 (three) times daily as needed for pain.      Marland Kitchen albuterol (PROVENTIL) (5 MG/ML) 0.5% nebulizer solution Take 0.5 mLs (2.5 mg total) by nebulization every 6 (six) hours.  20 mL  0  . fluticasone (FLOVENT HFA) 220 MCG/ACT inhaler Inhale 2 puffs into the lungs 2 (two) times daily.  1 Inhaler  0   No current facility-administered medications for this visit.   Past Medical History  Diagnosis Date  . Asthma   . Shortness of breath    Past Surgical History  Procedure Laterality Date  . Tubal ligation     No Known Allergies  Objective:   Physical Exam  Filed Vitals:   06/20/12 0952  BP: 112/80  Pulse: 60  Height: 5\' 1"  (1.549 m)  Weight: 215 lb 9.6 oz (97.796 kg)   Alert and oriented. Skin warm and dry. Oral mucosa is moist.   . Sclera anicteric, conjunctivae is pink. Thyroid not enlarged. No cervical lymphadenopathy. Lungs clear. Heart regular rate and rhythm.  Abdomen is soft. Bowel sounds are positive. No hepatomegaly. Abdomen is obese.  No abdominal masses felt. Slight suprapubic tenderness.  No edema to lower extremities.  Stool brown and guaiac negative today.      Assessment:   Lower abdominal pain probably related to cyst on her ovaries. Hx of fibroid uterus. She does feel better today.  Anemia: probably from her heavy periods. She was guaiac negative today in office. Really no GI symptoms.     Plan:    CBC, cmet today. I am going to discuss this case with Dr. Karilyn Cota when he returns. Ferritin, iron, TIBC, CBC . CA125. Further recommendations to follow.

## 2012-06-20 NOTE — Patient Instructions (Addendum)
Iron, ferritin, Cmet. I will discuss with Dr. Karilyn Cota.

## 2012-06-21 LAB — FERRITIN: Ferritin: 8 ng/mL — ABNORMAL LOW (ref 10–291)

## 2012-06-24 ENCOUNTER — Telehealth (INDEPENDENT_AMBULATORY_CARE_PROVIDER_SITE_OTHER): Payer: Self-pay | Admitting: Internal Medicine

## 2012-06-24 DIAGNOSIS — R109 Unspecified abdominal pain: Secondary | ICD-10-CM

## 2012-06-24 DIAGNOSIS — R933 Abnormal findings on diagnostic imaging of other parts of digestive tract: Secondary | ICD-10-CM

## 2012-06-24 NOTE — Telephone Encounter (Signed)
I spoke with patient. She will come by office tomorrow. She will pick up 3 hemocult cards. We are going to orer a small bowel follow thru. I discussed with this case with Dr. Karilyn Cota. If any stool cards are positive, she will need a colonoscopy.

## 2012-06-25 NOTE — Telephone Encounter (Signed)
SBFT sch'd 06/26/12 @ 930 (915), npo after midnight, patient aware

## 2012-06-26 ENCOUNTER — Ambulatory Visit (HOSPITAL_COMMUNITY)
Admission: RE | Admit: 2012-06-26 | Discharge: 2012-06-26 | Disposition: A | Discharge status: Home / Self Care | Payer: 59 | Source: Ambulatory Visit | Attending: Internal Medicine | Admitting: Internal Medicine

## 2012-06-26 DIAGNOSIS — R933 Abnormal findings on diagnostic imaging of other parts of digestive tract: Secondary | ICD-10-CM

## 2012-06-26 DIAGNOSIS — R109 Unspecified abdominal pain: Secondary | ICD-10-CM | POA: Insufficient documentation

## 2012-07-03 ENCOUNTER — Other Ambulatory Visit (INDEPENDENT_AMBULATORY_CARE_PROVIDER_SITE_OTHER): Payer: Self-pay | Admitting: *Deleted

## 2012-07-03 ENCOUNTER — Telehealth (INDEPENDENT_AMBULATORY_CARE_PROVIDER_SITE_OTHER): Payer: Self-pay | Admitting: *Deleted

## 2012-07-03 DIAGNOSIS — R109 Unspecified abdominal pain: Secondary | ICD-10-CM

## 2012-07-03 DIAGNOSIS — R195 Other fecal abnormalities: Secondary | ICD-10-CM

## 2012-07-03 MED ORDER — PEG-KCL-NACL-NASULF-NA ASC-C 100 G PO SOLR: 1.0000 | Freq: Once | ORAL | Status: DC

## 2012-07-03 NOTE — Telephone Encounter (Signed)
Patient needs movi prep 

## 2012-07-04 ENCOUNTER — Encounter (HOSPITAL_COMMUNITY): Payer: Self-pay | Admitting: Pharmacy Technician

## 2012-07-08 ENCOUNTER — Encounter (INDEPENDENT_AMBULATORY_CARE_PROVIDER_SITE_OTHER): Payer: Self-pay

## 2012-07-11 ENCOUNTER — Encounter (HOSPITAL_COMMUNITY): Payer: Self-pay | Admitting: *Deleted

## 2012-07-11 ENCOUNTER — Ambulatory Visit (HOSPITAL_COMMUNITY)
Admission: RE | Admit: 2012-07-11 | Discharge: 2012-07-11 | Disposition: A | Discharge status: Home / Self Care | Payer: 59 | Source: Ambulatory Visit | Attending: Internal Medicine | Admitting: Internal Medicine

## 2012-07-11 ENCOUNTER — Encounter (HOSPITAL_COMMUNITY)
Admission: RE | Disposition: A | Discharge status: Home / Self Care | Payer: Self-pay | Source: Ambulatory Visit | Attending: Internal Medicine

## 2012-07-11 DIAGNOSIS — D509 Iron deficiency anemia, unspecified: Secondary | ICD-10-CM | POA: Insufficient documentation

## 2012-07-11 DIAGNOSIS — R109 Unspecified abdominal pain: Secondary | ICD-10-CM | POA: Insufficient documentation

## 2012-07-11 DIAGNOSIS — Z888 Allergy status to other drugs, medicaments and biological substances status: Secondary | ICD-10-CM | POA: Insufficient documentation

## 2012-07-11 DIAGNOSIS — R195 Other fecal abnormalities: Secondary | ICD-10-CM | POA: Insufficient documentation

## 2012-07-11 DIAGNOSIS — Z79899 Other long term (current) drug therapy: Secondary | ICD-10-CM | POA: Insufficient documentation

## 2012-07-11 DIAGNOSIS — D126 Benign neoplasm of colon, unspecified: Secondary | ICD-10-CM | POA: Insufficient documentation

## 2012-07-11 DIAGNOSIS — K921 Melena: Secondary | ICD-10-CM

## 2012-07-11 DIAGNOSIS — J45909 Unspecified asthma, uncomplicated: Secondary | ICD-10-CM | POA: Insufficient documentation

## 2012-07-11 HISTORY — PX: COLONOSCOPY: SHX5424

## 2012-07-11 SURGERY — COLONOSCOPY
Anesthesia: Moderate Sedation

## 2012-07-11 MED ORDER — SODIUM CHLORIDE 0.9 % IV SOLN
INTRAVENOUS | Status: DC
  Administered 2012-07-11: 1000 mL via INTRAVENOUS

## 2012-07-11 MED ORDER — MIDAZOLAM HCL 5 MG/5ML IJ SOLN
INTRAMUSCULAR | Status: AC
  Filled 2012-07-11: qty 10

## 2012-07-11 MED ORDER — MEPERIDINE HCL 50 MG/ML IJ SOLN
INTRAMUSCULAR | Status: DC | PRN
  Administered 2012-07-11 (×2): 25 mg via INTRAVENOUS

## 2012-07-11 MED ORDER — FERROUS SULFATE 325 (65 FE) MG PO TABS: 325.0000 mg | ORAL_TABLET | Freq: Two times a day (BID) | ORAL | Status: DC

## 2012-07-11 MED ORDER — STERILE WATER FOR IRRIGATION IR SOLN
Status: DC | PRN
  Administered 2012-07-11: 08:00:00

## 2012-07-11 MED ORDER — DOCUSATE SODIUM 100 MG PO CAPS: 100.0000 mg | ORAL_CAPSULE | Freq: Two times a day (BID) | ORAL | Status: DC

## 2012-07-11 MED ORDER — MIDAZOLAM HCL 5 MG/5ML IJ SOLN
INTRAMUSCULAR | Status: DC | PRN
  Administered 2012-07-11 (×4): 2 mg via INTRAVENOUS

## 2012-07-11 MED ORDER — MEPERIDINE HCL 50 MG/ML IJ SOLN
INTRAMUSCULAR | Status: AC
  Filled 2012-07-11: qty 1

## 2012-07-11 MED ORDER — DICYCLOMINE HCL 10 MG PO CAPS: 10.0000 mg | ORAL_CAPSULE | Freq: Three times a day (TID) | ORAL | Status: DC

## 2012-07-11 NOTE — Op Note (Signed)
COLONOSCOPY PROCEDURE REPORT  PATIENT:  Melanie Boyd  MR#:  [id number] Birthdate:  12-Feb-1971, 42 y.o., female Endoscopist:  Dr. Malissa Hippo, MD Referred By:  Dr. Colette Ribas, MD  Procedure Date: 07/11/2012  Procedure:   Colonoscopy  Indications:  Patient is 42 year old female with lower abdominal pain iron deficiency anemia and heme-positive stool. She also has history of  ovarian cysts her pain is not felt to be secondary to the cysts  Informed Consent:  The procedure and risks were reviewed with the patient and informed consent was obtained.  Medications:  Demerol 50 mg IV Versed 8 mg IV  Description of procedure:  After a digital rectal exam was performed, that colonoscope was advanced from the anus through the rectum and colon to the area of the cecum, ileocecal valve and appendiceal orifice. The cecum was deeply intubated. These structures were well-seen and photographed for the record. From the level of the cecum and ileocecal valve, the scope was slowly and cautiously withdrawn. The mucosal surfaces were carefully surveyed utilizing scope tip to flexion to facilitate fold flattening as needed. The scope was pulled down into the rectum where a thorough exam including retroflexion was performed. Terminal ileum was also examined.  Findings:  Prep excellent. Normal mucosa of terminal ileum. 3 mm polyp ablated via cold biopsy from proximal sigmoid colon. No evidence of diverticulosis, erosions or ulcers. Normal rectal mucosa and anal rectal junction.    Therapeutic/Diagnostic Maneuvers Performed:  none  Complications:  None  Cecal Withdrawal Time:  9 minutes  Impression:  Normal terminal ileum. Normal colonoscopy except 3 mm polyp at proximal sigmoid colon which was ablated via cold biopsy.  Recommendations:  Standard instructions given. Ferrous sulfate 325 mg by mouth twice a day. Dicyclomine 10 mg by mouth 3 times a day. Colace 100 mg by mouth twice a  day. Office visit in 4 weeks  Irving Lubbers U  07/11/2012 8:24 AM  CC: Dr. Phillips Odor, Chancy Hurter, MD & Dr. No ref. provider found

## 2012-07-11 NOTE — H&P (Signed)
Melanie Boyd is an 42 y.o. female.   Chief Complaint: Patient is here for colonoscopy. HPI: Patient is 42 year old female in complaining of intermittent pain across her lower abdomen. She also has no sense of incomplete evacuation. Gynecologic evaluation revealed ovarian cyst well-developed source of the pain. During her workup she was found to have iron deficiency anemia. One out of 3 Hemoccults are positive. She says lately her periods been somewhat heavy and  Irregular. At time she is noted specks of blood with her bowel movements. She denies frank rectal bleeding or melena. She does take ibuprofen a few times a month for headache or premenstrual pain. He was diagnosed with iron deficiency anemia about a year ago and has been on iron. History is negative for CRC or IBD.  Past Medical History  Diagnosis Date  . Asthma   . Shortness of breath     Past Surgical History  Procedure Laterality Date  . Tubal ligation      History reviewed. No pertinent family history. Social History:  reports that she has never smoked. She does not have any smokeless tobacco history on file. She reports that she does not drink alcohol or use illicit drugs.  Allergies:  Allergies  Allergen Reactions  . Gabapentin     Vision loss, memory loss    Medications Prior to Admission  Medication Sig Dispense Refill  . albuterol (PROVENTIL) (5 MG/ML) 0.5% nebulizer solution Take 2.5 mg by nebulization as needed.      . IRON PO Take 65 mg by mouth every other day.      . peg 3350 powder (MOVIPREP) 100 G SOLR Take 1 kit (100 g total) by mouth once.  1 kit  0  . traMADol-acetaminophen (ULTRACET) 37.5-325 MG per tablet Take 1 tablet by mouth 3 (three) times daily as needed for pain.      . [DISCONTINUED] albuterol (PROVENTIL) (5 MG/ML) 0.5% nebulizer solution Take 0.5 mLs (2.5 mg total) by nebulization every 6 (six) hours.  20 mL  0  . [DISCONTINUED] fluticasone (FLOVENT HFA) 220 MCG/ACT inhaler Inhale 2 puffs into  the lungs 2 (two) times daily.  1 Inhaler  0    No results found for this or any previous visit (from the past 48 hour(s)). No results found.  ROS  Blood pressure 141/82, pulse 74, temperature 98.1 F (36.7 C), temperature source Oral, resp. rate 18, height 5\' 1"  (1.549 m), weight 215 lb (97.523 kg), last menstrual period 07/05/2012, SpO2 98.00%. Physical Exam  Constitutional: She appears well-developed and well-nourished.  HENT:  Mouth/Throat: Oropharynx is clear and moist.  Eyes: Conjunctivae are normal. No scleral icterus.  Neck: No thyromegaly present.  Cardiovascular: Normal rate, regular rhythm and normal heart sounds.   No murmur heard. Respiratory: Effort normal and breath sounds normal.  GI: Soft. She exhibits no distension and no mass. Tenderness: mild tenderness at LLQ. There is no guarding.  Musculoskeletal: She exhibits no edema.  Lymphadenopathy:    She has no cervical adenopathy.  Neurological: She is alert.  Skin: Skin is warm and dry.     Assessment/Plan Lower abdominal pain. Iron deficiency anemia and heme positive stool. Diagnostic colonoscopy.  REHMAN,NAJEEB U 07/11/2012, 7:40 AM

## 2012-07-16 ENCOUNTER — Encounter (HOSPITAL_COMMUNITY): Payer: Self-pay | Admitting: Internal Medicine

## 2012-07-17 ENCOUNTER — Encounter (INDEPENDENT_AMBULATORY_CARE_PROVIDER_SITE_OTHER): Payer: Self-pay | Admitting: *Deleted

## 2012-08-07 ENCOUNTER — Ambulatory Visit (INDEPENDENT_AMBULATORY_CARE_PROVIDER_SITE_OTHER): Payer: 59 | Admitting: Internal Medicine

## 2012-08-08 ENCOUNTER — Ambulatory Visit (INDEPENDENT_AMBULATORY_CARE_PROVIDER_SITE_OTHER): Payer: 59 | Admitting: Internal Medicine

## 2012-10-24 ENCOUNTER — Ambulatory Visit (HOSPITAL_COMMUNITY)
Admission: RE | Admit: 2012-10-24 | Discharge: 2012-10-24 | Disposition: A | Discharge status: Home / Self Care | Payer: 59 | Source: Ambulatory Visit | Attending: Family Medicine | Admitting: Family Medicine

## 2012-10-24 ENCOUNTER — Other Ambulatory Visit (HOSPITAL_COMMUNITY): Payer: Self-pay | Admitting: Family Medicine

## 2012-10-24 DIAGNOSIS — M546 Pain in thoracic spine: Secondary | ICD-10-CM

## 2012-10-24 DIAGNOSIS — R0789 Other chest pain: Secondary | ICD-10-CM

## 2012-10-24 MED ORDER — IOHEXOL 350 MG/ML SOLN
100.0000 mL | Freq: Once | INTRAVENOUS | Status: AC | PRN
  Administered 2012-10-24: 100 mL via INTRAVENOUS

## 2014-03-27 ENCOUNTER — Other Ambulatory Visit (HOSPITAL_COMMUNITY): Payer: Self-pay | Admitting: Family Medicine

## 2014-03-27 DIAGNOSIS — Z139 Encounter for screening, unspecified: Secondary | ICD-10-CM

## 2014-04-01 ENCOUNTER — Ambulatory Visit (HOSPITAL_COMMUNITY): Payer: 59

## 2016-02-28 DIAGNOSIS — Z6841 Body Mass Index (BMI) 40.0 and over, adult: Secondary | ICD-10-CM | POA: Diagnosis not present

## 2016-02-28 DIAGNOSIS — Z1389 Encounter for screening for other disorder: Secondary | ICD-10-CM | POA: Diagnosis not present

## 2016-02-28 DIAGNOSIS — J343 Hypertrophy of nasal turbinates: Secondary | ICD-10-CM | POA: Diagnosis not present

## 2016-02-28 DIAGNOSIS — J329 Chronic sinusitis, unspecified: Secondary | ICD-10-CM | POA: Diagnosis not present

## 2016-02-28 DIAGNOSIS — J069 Acute upper respiratory infection, unspecified: Secondary | ICD-10-CM | POA: Diagnosis not present

## 2016-02-28 DIAGNOSIS — R062 Wheezing: Secondary | ICD-10-CM | POA: Diagnosis not present

## 2016-02-28 DIAGNOSIS — J209 Acute bronchitis, unspecified: Secondary | ICD-10-CM | POA: Diagnosis not present

## 2016-03-23 DIAGNOSIS — Z1389 Encounter for screening for other disorder: Secondary | ICD-10-CM | POA: Diagnosis not present

## 2016-03-23 DIAGNOSIS — Z0000 Encounter for general adult medical examination without abnormal findings: Secondary | ICD-10-CM | POA: Diagnosis not present

## 2016-03-23 DIAGNOSIS — Z6841 Body Mass Index (BMI) 40.0 and over, adult: Secondary | ICD-10-CM | POA: Diagnosis not present

## 2016-03-28 ENCOUNTER — Other Ambulatory Visit (HOSPITAL_COMMUNITY): Payer: Self-pay | Admitting: Registered Nurse

## 2016-03-28 DIAGNOSIS — Z1231 Encounter for screening mammogram for malignant neoplasm of breast: Secondary | ICD-10-CM

## 2016-04-04 DIAGNOSIS — Z0000 Encounter for general adult medical examination without abnormal findings: Secondary | ICD-10-CM | POA: Diagnosis not present

## 2016-04-04 DIAGNOSIS — Z1389 Encounter for screening for other disorder: Secondary | ICD-10-CM | POA: Diagnosis not present

## 2016-04-11 DIAGNOSIS — Z6841 Body Mass Index (BMI) 40.0 and over, adult: Secondary | ICD-10-CM | POA: Diagnosis not present

## 2016-04-11 DIAGNOSIS — R229 Localized swelling, mass and lump, unspecified: Secondary | ICD-10-CM | POA: Diagnosis not present

## 2016-04-11 DIAGNOSIS — J45909 Unspecified asthma, uncomplicated: Secondary | ICD-10-CM | POA: Diagnosis not present

## 2016-04-11 DIAGNOSIS — Z1389 Encounter for screening for other disorder: Secondary | ICD-10-CM | POA: Diagnosis not present

## 2016-05-18 ENCOUNTER — Encounter: Payer: Self-pay | Admitting: General Surgery

## 2016-05-18 ENCOUNTER — Ambulatory Visit (INDEPENDENT_AMBULATORY_CARE_PROVIDER_SITE_OTHER): Payer: 59 | Admitting: General Surgery

## 2016-05-18 VITALS — BP 165/93 | HR 71 | Temp 97.8°F | Resp 18 | Ht 60.0 in | Wt 223.0 lb

## 2016-05-18 DIAGNOSIS — M799 Soft tissue disorder, unspecified: Secondary | ICD-10-CM | POA: Diagnosis not present

## 2016-05-18 DIAGNOSIS — M7989 Other specified soft tissue disorders: Secondary | ICD-10-CM

## 2016-05-18 NOTE — Progress Notes (Signed)
Melanie Boyd; [id number]; 05/14/1970   HPI Patient is a 46 year old Hispanic female who presents with a mass on her right thigh. It is been present for over a year. She states it seems to vary in size. No drainage has been noted. It can be tender to touch and cause discomfort with her underwear. He currently does not hurt. Past Medical History:  Diagnosis Date  . Asthma   . Shortness of breath     Past Surgical History:  Procedure Laterality Date  . COLONOSCOPY N/A 07/11/2012   Procedure: COLONOSCOPY;  Surgeon: Rogene Houston, MD;  Location: [facility];  Service: Endoscopy;  Laterality: N/A;  730  . TUBAL LIGATION      No family history on file.  Current Outpatient Prescriptions on File Prior to Visit  Medication Sig Dispense Refill  . albuterol (PROVENTIL) (5 MG/ML) 0.5% nebulizer solution Take 2.5 mg by nebulization as needed.    . dicyclomine (BENTYL) 10 MG capsule Take 1 capsule (10 mg total) by mouth 3 (three) times daily before meals. 90 capsule 3  . docusate sodium (COLACE) 100 MG capsule Take 1 capsule (100 mg total) by mouth 2 (two) times daily. 10 capsule 0  . ferrous sulfate 325 (65 FE) MG tablet Take 1 tablet (325 mg total) by mouth 2 (two) times daily with a meal.  3  . fluticasone (FLOVENT HFA) 220 MCG/ACT inhaler Inhale 2 puffs into the lungs as needed.    . peg 3350 powder (MOVIPREP) 100 G SOLR Take 1 kit (100 g total) by mouth once. 1 kit 0  . traMADol-acetaminophen (ULTRACET) 37.5-325 MG per tablet Take 1 tablet by mouth 3 (three) times daily as needed for pain.     No current facility-administered medications on file prior to visit.     Allergies  Allergen Reactions  . Gabapentin     Vision loss, memory loss    History  Alcohol Use No    History  Smoking Status  . Never Smoker  Smokeless Tobacco  . Never Used    Review of Systems  Constitutional: Negative.   HENT: Negative.   Eyes: Negative.   Respiratory: Positive for shortness of breath.    Cardiovascular: Negative.   Gastrointestinal: Negative.   Genitourinary: Negative.   Musculoskeletal: Negative.   Skin: Negative.   Neurological: Negative.   Endo/Heme/Allergies: Negative.   Psychiatric/Behavioral: Negative.     Objective   Vitals:   05/18/16 1011  BP: (!) 165/93  Pulse: 71  Resp: 18  Temp: 97.8 F (36.6 C)    Physical Exam  Constitutional: She is oriented to person, place, and time and well-developed, well-nourished, and in no distress.  HENT:  Head: Normocephalic and atraumatic.  Neck: Normal range of motion. Neck supple.  Cardiovascular: Normal rate, regular rhythm and normal heart sounds.   No murmur heard. Pulmonary/Chest: Effort normal and breath sounds normal. She has no wheezes. She has no rales.  Musculoskeletal:  1.5 cm raised subcutaneous soft, rubbery mass noted along the right upper inner thigh. No skin changes noted. Well circumscribed.  Neurological: She is alert and oriented to person, place, and time.  Skin: Skin is warm and dry.  Vitals reviewed.  Dr. Delanna Ahmadi notes reviewed. Assessment  Soft tissue mass, right thigh Plan   Scheduled for excision of the soft tissue mass, right thigh on 06/07/2016. The risks and benefits of the procedure including bleeding and infection were fully explained to the patient, who gave informed consent.

## 2016-05-18 NOTE — H&P (Signed)
Melanie Boyd; [id number]; 04/15/1970   HPI Patient is a 46 year old Hispanic female who presents with a mass on her right thigh. It is been present for over a year. She states it seems to vary in size. No drainage has been noted. It can be tender to touch and cause discomfort with her underwear. He currently does not hurt.     Past Medical History:  Diagnosis Date  . Asthma   . Shortness of breath          Past Surgical History:  Procedure Laterality Date  . COLONOSCOPY N/A 07/11/2012   Procedure: COLONOSCOPY;  Surgeon: Rogene Houston, MD;  Location: [facility];  Service: Endoscopy;  Laterality: N/A;  730  . TUBAL LIGATION      No family history on file.        Current Outpatient Prescriptions on File Prior to Visit  Medication Sig Dispense Refill  . albuterol (PROVENTIL) (5 MG/ML) 0.5% nebulizer solution Take 2.5 mg by nebulization as needed.    . dicyclomine (BENTYL) 10 MG capsule Take 1 capsule (10 mg total) by mouth 3 (three) times daily before meals. 90 capsule 3  . docusate sodium (COLACE) 100 MG capsule Take 1 capsule (100 mg total) by mouth 2 (two) times daily. 10 capsule 0  . ferrous sulfate 325 (65 FE) MG tablet Take 1 tablet (325 mg total) by mouth 2 (two) times daily with a meal.  3  . fluticasone (FLOVENT HFA) 220 MCG/ACT inhaler Inhale 2 puffs into the lungs as needed.    . peg 3350 powder (MOVIPREP) 100 G SOLR Take 1 kit (100 g total) by mouth once. 1 kit 0  . traMADol-acetaminophen (ULTRACET) 37.5-325 MG per tablet Take 1 tablet by mouth 3 (three) times daily as needed for pain.     No current facility-administered medications on file prior to visit.          Allergies  Allergen Reactions  . Gabapentin     Vision loss, memory loss       History  Alcohol Use No       History  Smoking Status  . Never Smoker  Smokeless Tobacco  . Never Used    Review of Systems  Constitutional: Negative.   HENT: Negative.    Eyes: Negative.   Respiratory: Positive for shortness of breath.   Cardiovascular: Negative.   Gastrointestinal: Negative.   Genitourinary: Negative.   Musculoskeletal: Negative.   Skin: Negative.   Neurological: Negative.   Endo/Heme/Allergies: Negative.   Psychiatric/Behavioral: Negative.     Objective      Vitals:   05/18/16 1011  BP: (!) 165/93  Pulse: 71  Resp: 18  Temp: 97.8 F (36.6 C)    Physical Exam  Constitutional: She is oriented to person, place, and time and well-developed, well-nourished, and in no distress.  HENT:  Head: Normocephalic and atraumatic.  Neck: Normal range of motion. Neck supple.  Cardiovascular: Normal rate, regular rhythm and normal heart sounds.   No murmur heard. Pulmonary/Chest: Effort normal and breath sounds normal. She has no wheezes. She has no rales.  Musculoskeletal:  1.5 cm raised subcutaneous soft, rubbery mass noted along the right upper inner thigh. No skin changes noted. Well circumscribed.  Neurological: She is alert and oriented to person, place, and time.  Skin: Skin is warm and dry.  Vitals reviewed.  Dr. Delanna Ahmadi notes reviewed. Assessment  Soft tissue mass, right thigh Plan   Scheduled for excision of the soft tissue  mass, right thigh on 06/07/2016. The risks and benefits of the procedure including bleeding and infection were fully explained to the patient, who gave informed consent.

## 2016-05-30 ENCOUNTER — Encounter (HOSPITAL_COMMUNITY): Payer: Self-pay

## 2016-05-30 NOTE — Patient Instructions (Signed)
    Melanie Boyd  05/30/2016     @PREFPERIOPPHARMACY @   Your procedure is scheduled on 06/07/2016.  Report to [facility] at 6:15 A.M.  Call this number if you have problems the morning of surgery:  [phone]   Remember:  Do not eat food or drink liquids after midnight.  Take these medicines the morning of surgery with A SIP OF WATER : none.  Please use your inhaler at home and bring it with you to the hospital.   Do not wear jewelry, make-up or nail polish.  Do not wear lotions, powders, or perfumes, or deoderant.  Do not shave 48 hours prior to surgery.  Men may shave face and neck.  Do not bring valuables to the hospital.  [facility] is not responsible for any belongings or valuables.  Contacts, dentures or bridgework may not be worn into surgery.  Leave your suitcase in the car.  After surgery it may be brought to your room.  For patients admitted to the hospital, discharge time will be determined by your treatment team.  Patients discharged the day of surgery will not be allowed to drive home.   Name and phone number of your driver:   family Special instructions:  N/a  Please read over the following fact sheets that you were given. Care and Recovery After Surgery

## 2016-06-02 ENCOUNTER — Encounter (HOSPITAL_COMMUNITY): Payer: Self-pay

## 2016-06-02 ENCOUNTER — Encounter (HOSPITAL_COMMUNITY)
Admission: RE | Admit: 2016-06-02 | Discharge: 2016-06-02 | Disposition: A | Discharge status: Home / Self Care | Payer: 59 | Source: Ambulatory Visit | Attending: General Surgery | Admitting: General Surgery

## 2016-06-02 DIAGNOSIS — Z01812 Encounter for preprocedural laboratory examination: Secondary | ICD-10-CM | POA: Diagnosis not present

## 2016-06-02 HISTORY — DX: Essential (primary) hypertension: I10

## 2016-06-02 HISTORY — DX: Anemia, unspecified: D64.9

## 2016-06-02 LAB — CBC WITH DIFFERENTIAL/PLATELET
Basophils Absolute: 0 10*3/uL (ref 0.0–0.1)
Basophils Relative: 0 %
Eosinophils Absolute: 0.3 10*3/uL (ref 0.0–0.7)
Eosinophils Relative: 3 %
HEMATOCRIT: 32.9 % — AB (ref 36.0–46.0)
HEMOGLOBIN: 10.8 g/dL — AB (ref 12.0–15.0)
Lymphocytes Relative: 21 %
Lymphs Abs: 2 10*3/uL (ref 0.7–4.0)
MCH: 24.2 pg — ABNORMAL LOW (ref 26.0–34.0)
MCHC: 32.8 g/dL (ref 30.0–36.0)
MCV: 73.8 fL — AB (ref 78.0–100.0)
Monocytes Absolute: 0.6 10*3/uL (ref 0.1–1.0)
Monocytes Relative: 6 %
NEUTROS ABS: 6.9 10*3/uL (ref 1.7–7.7)
Neutrophils Relative %: 70 %
Platelets: 302 10*3/uL (ref 150–400)
RBC: 4.46 MIL/uL (ref 3.87–5.11)
RDW: 17.5 % — ABNORMAL HIGH (ref 11.5–15.5)
WBC: 9.9 10*3/uL (ref 4.0–10.5)

## 2016-06-02 LAB — BASIC METABOLIC PANEL
ANION GAP: 9 (ref 5–15)
BUN: 16 mg/dL (ref 6–20)
CO2: 24 mmol/L (ref 22–32)
Calcium: 9.4 mg/dL (ref 8.9–10.3)
Chloride: 105 mmol/L (ref 101–111)
Creatinine, Ser: 0.43 mg/dL — ABNORMAL LOW (ref 0.44–1.00)
GFR calc Af Amer: >60 mL/min (ref >60)
Glucose, Bld: 134 mg/dL — ABNORMAL HIGH (ref 65–99)
POTASSIUM: 4.2 mmol/L (ref 3.5–5.1)
SODIUM: 138 mmol/L (ref 135–145)

## 2016-06-02 LAB — HCG, SERUM, QUALITATIVE: PREG SERUM: NEGATIVE

## 2016-06-07 ENCOUNTER — Encounter (HOSPITAL_COMMUNITY)
Admission: RE | Disposition: A | Discharge status: Home / Self Care | Payer: Self-pay | Source: Ambulatory Visit | Attending: General Surgery

## 2016-06-07 ENCOUNTER — Ambulatory Visit (HOSPITAL_COMMUNITY): Payer: 59 | Admitting: Anesthesiology

## 2016-06-07 ENCOUNTER — Ambulatory Visit (HOSPITAL_COMMUNITY)
Admission: RE | Admit: 2016-06-07 | Discharge: 2016-06-07 | Disposition: A | Discharge status: Home / Self Care | Payer: 59 | Source: Ambulatory Visit | Attending: General Surgery | Admitting: General Surgery

## 2016-06-07 ENCOUNTER — Encounter (HOSPITAL_COMMUNITY): Payer: Self-pay | Admitting: Anesthesiology

## 2016-06-07 DIAGNOSIS — I10 Essential (primary) hypertension: Secondary | ICD-10-CM | POA: Diagnosis not present

## 2016-06-07 DIAGNOSIS — J45909 Unspecified asthma, uncomplicated: Secondary | ICD-10-CM | POA: Insufficient documentation

## 2016-06-07 DIAGNOSIS — Z79899 Other long term (current) drug therapy: Secondary | ICD-10-CM | POA: Diagnosis not present

## 2016-06-07 DIAGNOSIS — L723 Sebaceous cyst: Secondary | ICD-10-CM | POA: Insufficient documentation

## 2016-06-07 DIAGNOSIS — R2241 Localized swelling, mass and lump, right lower limb: Secondary | ICD-10-CM | POA: Diagnosis not present

## 2016-06-07 HISTORY — PX: MASS EXCISION: SHX2000

## 2016-06-07 SURGERY — EXCISION MASS
Anesthesia: General | Site: Thigh | Laterality: Right

## 2016-06-07 MED ORDER — MIDAZOLAM HCL 2 MG/2ML IJ SOLN
INTRAMUSCULAR | Status: AC
  Filled 2016-06-07: qty 2

## 2016-06-07 MED ORDER — HYDROMORPHONE HCL 1 MG/ML IJ SOLN: 0.2500 mg | INTRAMUSCULAR | Status: DC | PRN

## 2016-06-07 MED ORDER — KETOROLAC TROMETHAMINE 30 MG/ML IJ SOLN
30.0000 mg | Freq: Once | INTRAMUSCULAR | Status: AC
  Administered 2016-06-07: 30 mg via INTRAVENOUS

## 2016-06-07 MED ORDER — KETOROLAC TROMETHAMINE 30 MG/ML IJ SOLN
INTRAMUSCULAR | Status: AC
  Filled 2016-06-07: qty 1

## 2016-06-07 MED ORDER — ONDANSETRON HCL 4 MG/2ML IJ SOLN
INTRAMUSCULAR | Status: AC
  Filled 2016-06-07: qty 2

## 2016-06-07 MED ORDER — LIDOCAINE HCL (CARDIAC) 10 MG/ML IV SOLN
INTRAVENOUS | Status: DC | PRN
  Administered 2016-06-07: 40 mg via INTRAVENOUS

## 2016-06-07 MED ORDER — PROPOFOL 10 MG/ML IV BOLUS
INTRAVENOUS | Status: DC | PRN
  Administered 2016-06-07: 150 mg via INTRAVENOUS
  Administered 2016-06-07: 50 mg via INTRAVENOUS

## 2016-06-07 MED ORDER — POVIDONE-IODINE 10 % EX OINT
TOPICAL_OINTMENT | CUTANEOUS | Status: AC
  Filled 2016-06-07: qty 1

## 2016-06-07 MED ORDER — BUPIVACAINE HCL (PF) 0.5 % IJ SOLN
INTRAMUSCULAR | Status: DC | PRN
  Administered 2016-06-07: 5 mL

## 2016-06-07 MED ORDER — FENTANYL CITRATE (PF) 100 MCG/2ML IJ SOLN
INTRAMUSCULAR | Status: AC
  Filled 2016-06-07: qty 2

## 2016-06-07 MED ORDER — CHLORHEXIDINE GLUCONATE CLOTH 2 % EX PADS: 6.0000 | MEDICATED_PAD | Freq: Once | CUTANEOUS | Status: DC

## 2016-06-07 MED ORDER — LACTATED RINGERS IV SOLN
INTRAVENOUS | Status: DC
  Administered 2016-06-07: 07:00:00 via INTRAVENOUS

## 2016-06-07 MED ORDER — MIDAZOLAM HCL 2 MG/2ML IJ SOLN
1.0000 mg | INTRAMUSCULAR | Status: AC
  Administered 2016-06-07: 2 mg via INTRAVENOUS

## 2016-06-07 MED ORDER — HYDROCODONE-ACETAMINOPHEN 5-325 MG PO TABS: 1.0000 | ORAL_TABLET | ORAL | 0 refills | Status: DC | PRN

## 2016-06-07 MED ORDER — BUPIVACAINE HCL (PF) 0.5 % IJ SOLN
INTRAMUSCULAR | Status: AC
  Filled 2016-06-07: qty 30

## 2016-06-07 MED ORDER — ONDANSETRON HCL 4 MG/2ML IJ SOLN
4.0000 mg | Freq: Once | INTRAMUSCULAR | Status: AC
  Administered 2016-06-07: 4 mg via INTRAVENOUS

## 2016-06-07 MED ORDER — FENTANYL CITRATE (PF) 100 MCG/2ML IJ SOLN
INTRAMUSCULAR | Status: DC | PRN
  Administered 2016-06-07 (×3): 25 ug via INTRAVENOUS

## 2016-06-07 MED ORDER — 0.9 % SODIUM CHLORIDE (POUR BTL) OPTIME
TOPICAL | Status: DC | PRN
  Administered 2016-06-07: 1000 mL

## 2016-06-07 SURGICAL SUPPLY — 34 items
BAG HAMPER (MISCELLANEOUS) ×2 IMPLANT
BLADE SURG SZ11 CARB STEEL (BLADE) IMPLANT
CHLORAPREP W/TINT 10.5 ML (MISCELLANEOUS) ×2 IMPLANT
CLOTH BEACON ORANGE TIMEOUT ST (SAFETY) ×2 IMPLANT
COVER LIGHT HANDLE STERIS (MISCELLANEOUS) ×4 IMPLANT
DECANTER SPIKE VIAL GLASS SM (MISCELLANEOUS) ×2 IMPLANT
DERMABOND ADVANCED (GAUZE/BANDAGES/DRESSINGS) ×1
DERMABOND ADVANCED .7 DNX12 (GAUZE/BANDAGES/DRESSINGS) ×1 IMPLANT
DRAPE EENT ADH APERT 31X51 STR (DRAPES) IMPLANT
ELECT NEEDLE TIP 2.8 STRL (NEEDLE) ×2 IMPLANT
ELECT REM PT RETURN 9FT ADLT (ELECTROSURGICAL) ×2
ELECTRODE REM PT RTRN 9FT ADLT (ELECTROSURGICAL) ×1 IMPLANT
FORMALIN 10 PREFIL 120ML (MISCELLANEOUS) IMPLANT
GLOVE BIOGEL PI IND STRL 7.0 (GLOVE) ×1 IMPLANT
GLOVE BIOGEL PI INDICATOR 7.0 (GLOVE) ×1
GLOVE SURG SS PI 7.5 STRL IVOR (GLOVE) ×2 IMPLANT
GOWN STRL REUS W/ TWL XL LVL3 (GOWN DISPOSABLE) ×1 IMPLANT
GOWN STRL REUS W/TWL LRG LVL3 (GOWN DISPOSABLE) ×2 IMPLANT
GOWN STRL REUS W/TWL XL LVL3 (GOWN DISPOSABLE) ×1
KIT ROOM TURNOVER APOR (KITS) ×2 IMPLANT
MANIFOLD NEPTUNE II (INSTRUMENTS) ×2 IMPLANT
NEEDLE 22X1 1/2 OR ONLY (MISCELLANEOUS)
NEEDLE 22X1.5 STRL (OR ONLY) (MISCELLANEOUS) IMPLANT
NEEDLE HYPO 25X1 1.5 SAFETY (NEEDLE) ×2 IMPLANT
NS IRRIG 1000ML POUR BTL (IV SOLUTION) ×2 IMPLANT
PACK MINOR (CUSTOM PROCEDURE TRAY) ×2 IMPLANT
PAD ARMBOARD 7.5X6 YLW CONV (MISCELLANEOUS) ×2 IMPLANT
SET BASIN LINEN APH (SET/KITS/TRAYS/PACK) ×2 IMPLANT
SUT ETHILON 3 0 FSL (SUTURE) IMPLANT
SUT PROLENE 4 0 PS 2 18 (SUTURE) IMPLANT
SUT VIC AB 3-0 SH 27 (SUTURE) ×1
SUT VIC AB 3-0 SH 27X BRD (SUTURE) ×1 IMPLANT
SUT VIC AB 4-0 PS2 27 (SUTURE) ×2 IMPLANT
SYR CONTROL 10ML LL (SYRINGE) ×2 IMPLANT

## 2016-06-07 NOTE — Transfer of Care (Signed)
Immediate Anesthesia Transfer of Care Note  Patient: Melanie Boyd  Procedure(s) Performed: Procedure(s): EXCISION 1.5 CM MASS RIGHT THIGH (Right)  Patient Location: PACU  Anesthesia Type:General  Level of Consciousness: awake and alert   Airway & Oxygen Therapy: Patient Spontanous Breathing  Post-op Assessment: Report given to RN and Post -op Vital signs reviewed and stable  Post vital signs: Reviewed and stable  Last Vitals:  Vitals:   06/07/16 0645 06/07/16 0700  BP: (!) 163/88 (!) 158/97  Resp: (!) 25 20  Temp:      Last Pain:  Vitals:   06/07/16 0628  TempSrc: Oral      Patients Stated Pain Goal: 5 (37/00/52 5910)  Complications: No apparent anesthesia complications

## 2016-06-07 NOTE — Interval H&P Note (Signed)
History and Physical Interval Note:  06/07/2016 7:13 AM  Melanie Boyd  has presented today for surgery, with the diagnosis of soft tissue mass right thigh  The various methods of treatment have been discussed with the patient and family. After consideration of risks, benefits and other options for treatment, the patient has consented to  Procedure(s): EXCISION 1.5 CM MASS RIGHT THIGH (Right) as a surgical intervention .  The patient's history has been reviewed, patient examined, no change in status, stable for surgery.  I have reviewed the patient's chart and labs.  Questions were answered to the patient's satisfaction.     Aviva Signs

## 2016-06-07 NOTE — Op Note (Signed)
Patient:  Melanie Boyd  DOB:  09-Feb-1971  MRN:  [id number]   Preop Diagnosis:  Subcutaneous mass, right thigh  Postop Diagnosis:  1.5 cm sebaceous cyst, right thigh  Procedure:  Excision of 1.5 cm sebaceous cyst, right thigh  Surgeon:  Aviva Signs, M.D.  Anes:  Gen.  Indications:  Patient is a 46 year old Hispanic female who presents with an enlarging soft tissue mass in the right thigh. The risks and benefits of the procedure including bleeding and infection were fully explained to the patient, who gave informed consent.  Procedure note:  The patient was placed in the supine position. After general anesthesia was administered, the right upper, inner thigh was prepped and draped using usual sterile technique with DuraPrep. Surgical site confirmation was performed.  An elliptical incision was made around the raised mass. This was taken down to the subcutaneous tissue. The sebaceous cyst was found. It was excised in total without difficulty. It was disposed of. A bleeding was controlled using Bovie electrocautery. 0.5% Sensorcaine was instilled into the surrounding wound. The skin edges were reapproximated using a 4-0 Vicryl subcuticular suture. Dermabond was applied.  All tape and needle counts were correct at the end of the procedure. The patient was awakened and transferred to PACU in stable condition.  Complications:  None  EBL:  None  Specimen:  None

## 2016-06-07 NOTE — Anesthesia Postprocedure Evaluation (Signed)
Anesthesia Post Note  Patient: Melanie Boyd  Procedure(s) Performed: Procedure(s) (LRB): EXCISION 1.5 CM MASS RIGHT THIGH (Right)  Patient location during evaluation: PACU Anesthesia Type: General Level of consciousness: awake and alert and oriented Pain management: pain level controlled Vital Signs Assessment: post-procedure vital signs reviewed and stable Respiratory status: spontaneous breathing Cardiovascular status: blood pressure returned to baseline and stable Postop Assessment: no signs of nausea or vomiting Anesthetic complications: no Comments: Late entry.     Last Vitals:  Vitals:   06/07/16 0809 06/07/16 0839  BP: (!) 145/76 (!) 157/90  Pulse: 83 63  Resp: 20 20  Temp: 36.6 C 36.4 C    Last Pain:  Vitals:   06/07/16 0839  TempSrc: Oral                 Ksenia Kunz

## 2016-06-07 NOTE — Anesthesia Procedure Notes (Signed)
Procedure Name: LMA Insertion Date/Time: 06/07/2016 7:43 AM Performed by: Tressie Stalker E Pre-anesthesia Checklist: Patient identified, Patient being monitored, Emergency Drugs available, Timeout performed and Suction available Patient Re-evaluated:Patient Re-evaluated prior to inductionOxygen Delivery Method: Circle System Utilized Preoxygenation: Pre-oxygenation with 100% oxygen Intubation Type: IV induction Ventilation: Mask ventilation without difficulty LMA: LMA inserted LMA Size: 3.0 Number of attempts: 1 Placement Confirmation: positive ETCO2 and breath sounds checked- equal and bilateral

## 2016-06-07 NOTE — Anesthesia Preprocedure Evaluation (Signed)
Anesthesia Evaluation  Patient identified by MRN, date of birth, ID band Patient awake    Reviewed: Allergy & Precautions, NPO status , Patient's Chart, lab work & pertinent test results  Airway Mallampati: III  TM Distance: <3 FB   Mouth opening: Limited Mouth Opening  Dental  (+) Teeth Intact   Pulmonary shortness of breath, asthma ,    breath sounds clear to auscultation       Cardiovascular hypertension, Pt. on medications  Rhythm:Regular Rate:Normal     Neuro/Psych negative neurological ROS  negative psych ROS   GI/Hepatic negative GI ROS,   Endo/Other    Renal/GU      Musculoskeletal   Abdominal   Peds  Hematology  (+) anemia ,   Anesthesia Other Findings   Reproductive/Obstetrics                             Anesthesia Physical Anesthesia Plan  ASA: II  Anesthesia Plan: General   Post-op Pain Management:    Induction: Intravenous  Airway Management Planned: LMA  Additional Equipment:   Intra-op Plan:   Post-operative Plan: Extubation in OR  Informed Consent: I have reviewed the patients History and Physical, chart, labs and discussed the procedure including the risks, benefits and alternatives for the proposed anesthesia with the patient or authorized representative who has indicated his/her understanding and acceptance.     Plan Discussed with:   Anesthesia Plan Comments:         Anesthesia Quick Evaluation

## 2016-06-07 NOTE — Discharge Instructions (Signed)
Epidermal Cyst An epidermal cyst is a small, painless lump under your skin. It may be called an epidermal inclusion cyst or an infundibular cyst. The cyst contains a grayish-white, bad-smelling substance (keratin). It is important not to pop epidermal cysts yourself. These cysts are usually harmless (benign), but they can get infected. Symptoms of infection may include:  Redness.  Inflammation.  Tenderness.  Warmth.  Fever.  A grayish-white, bad-smelling substance draining from the cyst.  Pus draining from the cyst. Follow these instructions at home:  Take over-the-counter and prescription medicines only as told by your doctor.  If you were prescribed an antibiotic, use it as told by your doctor. Do not stop using the antibiotic even if you start to feel better.  Keep the area around your cyst clean and dry.  Wear loose, dry clothing.  Do not try to pop your cyst.  Avoid touching your cyst.  Check your cyst every day for signs of infection.  Keep all follow-up visits as told by your doctor. This is important. How is this prevented?  Wear clean, dry, clothing.  Avoid wearing tight clothing.  Keep your skin clean and dry. Shower or take baths every day.  Wash your body with a benzoyl peroxide wash when you shower or bathe. Contact a health care provider if:  Your cyst has symptoms of infection.  Your condition is not improving or is getting worse.  You have a cyst that looks different from other cysts you have had.  You have a fever. Get help right away if:  Redness spreads from the cyst into the surrounding area. This information is not intended to replace advice given to you by your health care provider. Make sure you discuss any questions you have with your health care provider. Document Released: 03/16/2004 Document Revised: 10/06/2015 Document Reviewed: 12/09/2014 Elsevier Interactive Patient Education  2017 [employer]  

## 2016-06-08 ENCOUNTER — Encounter (HOSPITAL_COMMUNITY): Payer: Self-pay | Admitting: General Surgery

## 2017-03-19 DIAGNOSIS — R0602 Shortness of breath: Secondary | ICD-10-CM | POA: Diagnosis not present

## 2017-03-19 DIAGNOSIS — J45909 Unspecified asthma, uncomplicated: Secondary | ICD-10-CM | POA: Diagnosis not present

## 2017-03-19 DIAGNOSIS — R05 Cough: Secondary | ICD-10-CM | POA: Diagnosis not present

## 2017-03-19 DIAGNOSIS — Z6841 Body Mass Index (BMI) 40.0 and over, adult: Secondary | ICD-10-CM | POA: Diagnosis not present

## 2017-03-19 DIAGNOSIS — J329 Chronic sinusitis, unspecified: Secondary | ICD-10-CM | POA: Diagnosis not present

## 2017-03-19 DIAGNOSIS — Z1389 Encounter for screening for other disorder: Secondary | ICD-10-CM | POA: Diagnosis not present

## 2017-10-16 DIAGNOSIS — R61 Generalized hyperhidrosis: Secondary | ICD-10-CM | POA: Diagnosis not present

## 2017-10-16 DIAGNOSIS — Z6841 Body Mass Index (BMI) 40.0 and over, adult: Secondary | ICD-10-CM | POA: Diagnosis not present

## 2017-10-16 DIAGNOSIS — Z1389 Encounter for screening for other disorder: Secondary | ICD-10-CM | POA: Diagnosis not present

## 2017-10-16 DIAGNOSIS — R0602 Shortness of breath: Secondary | ICD-10-CM | POA: Diagnosis not present

## 2017-10-16 DIAGNOSIS — R0789 Other chest pain: Secondary | ICD-10-CM | POA: Diagnosis not present

## 2017-10-16 DIAGNOSIS — J45909 Unspecified asthma, uncomplicated: Secondary | ICD-10-CM | POA: Diagnosis not present

## 2017-10-17 ENCOUNTER — Ambulatory Visit (HOSPITAL_COMMUNITY): Admission: EM | Admit: 2017-10-17 | Discharge: 2017-10-17 | Payer: 59 | Source: Home / Self Care

## 2017-10-17 ENCOUNTER — Emergency Department (HOSPITAL_COMMUNITY)
Admission: EM | Admit: 2017-10-17 | Discharge: 2017-10-17 | Disposition: A | Discharge status: Home / Self Care | Payer: 59 | Attending: Emergency Medicine | Admitting: Emergency Medicine

## 2017-10-17 ENCOUNTER — Encounter (HOSPITAL_COMMUNITY): Payer: Self-pay | Admitting: Emergency Medicine

## 2017-10-17 ENCOUNTER — Other Ambulatory Visit: Payer: Self-pay

## 2017-10-17 ENCOUNTER — Emergency Department (HOSPITAL_COMMUNITY): Payer: 59

## 2017-10-17 DIAGNOSIS — J45909 Unspecified asthma, uncomplicated: Secondary | ICD-10-CM | POA: Insufficient documentation

## 2017-10-17 DIAGNOSIS — I10 Essential (primary) hypertension: Secondary | ICD-10-CM | POA: Insufficient documentation

## 2017-10-17 DIAGNOSIS — R0789 Other chest pain: Secondary | ICD-10-CM | POA: Insufficient documentation

## 2017-10-17 DIAGNOSIS — Z79899 Other long term (current) drug therapy: Secondary | ICD-10-CM | POA: Diagnosis not present

## 2017-10-17 DIAGNOSIS — R0602 Shortness of breath: Secondary | ICD-10-CM | POA: Diagnosis not present

## 2017-10-17 LAB — BASIC METABOLIC PANEL
Anion gap: 7 (ref 5–15)
BUN: 17 mg/dL (ref 6–20)
CHLORIDE: 106 mmol/L (ref 98–111)
CO2: 26 mmol/L (ref 22–32)
CREATININE: 0.5 mg/dL (ref 0.44–1.00)
Calcium: 9.5 mg/dL (ref 8.9–10.3)
Glucose, Bld: 104 mg/dL — ABNORMAL HIGH (ref 70–99)
Potassium: 4.2 mmol/L (ref 3.5–5.1)
SODIUM: 139 mmol/L (ref 135–145)

## 2017-10-17 LAB — CBC
HCT: 33.3 % — ABNORMAL LOW (ref 36.0–46.0)
Hemoglobin: 9.7 g/dL — ABNORMAL LOW (ref 12.0–15.0)
MCH: 20.2 pg — ABNORMAL LOW (ref 26.0–34.0)
MCHC: 29.1 g/dL — ABNORMAL LOW (ref 30.0–36.0)
MCV: 69.2 fL — AB (ref 78.0–100.0)
PLATELETS: 332 10*3/uL (ref 150–400)
RBC: 4.81 MIL/uL (ref 3.87–5.11)
RDW: 19.3 % — AB (ref 11.5–15.5)
WBC: 9.6 10*3/uL (ref 4.0–10.5)

## 2017-10-17 LAB — I-STAT TROPONIN, ED: TROPONIN I, POC: 0 ng/mL (ref 0.00–0.08)

## 2017-10-17 LAB — I-STAT BETA HCG BLOOD, ED (MC, WL, AP ONLY)

## 2017-10-17 MED ORDER — PREDNISONE 20 MG PO TABS: 40.0000 mg | ORAL_TABLET | Freq: Every day | ORAL | 0 refills | Status: DC

## 2017-10-17 MED ORDER — PREDNISONE 20 MG PO TABS
60.0000 mg | ORAL_TABLET | ORAL | Status: AC
Start: 2017-10-17 — End: 2017-10-17
  Administered 2017-10-17: 60 mg via ORAL
  Filled 2017-10-17: qty 3

## 2017-10-17 MED ORDER — ALBUTEROL SULFATE (2.5 MG/3ML) 0.083% IN NEBU
5.0000 mg | INHALATION_SOLUTION | Freq: Once | RESPIRATORY_TRACT | Status: AC
  Administered 2017-10-17: 5 mg via RESPIRATORY_TRACT
  Filled 2017-10-17: qty 6

## 2017-10-17 NOTE — ED Provider Notes (Signed)
[place] EMERGENCY DEPARTMENT Provider Note   CSN: [id number] Arrival date & time: 10/17/17  1245     History   Chief Complaint Chief Complaint  Patient presents with  . Shortness of Breath  . Chest Pain    HPI Melanie Boyd is a 47 y.o. female.  HPI Patient presents with concern of chest tightness and pain. Symptoms been present for about 3 days, began after she was driving with dogs in the car. She does have a history of asthma, notes that she had some relief with albuterol. Today, with chest tightness, described as sternal discomfort and generalized tightness she went to primary care, was found to have EKG different from 5 years ago, and was sent here for evaluation. She states that after the albuterol she feels better, with resolution of the tightness.  Not she has had no other complaints including fever, nausea, vomiting. She denies problems beyond hypertension, anemia, asthma.  Past Medical History:  Diagnosis Date  . Anemia   . Asthma   . Hypertension   . Shortness of breath     Patient Active Problem List   Diagnosis Date Noted  . Sebaceous cyst   . Abdominal pain 05/30/2012  . Nausea with vomiting 05/30/2012  . Bronchitis, acute 12/05/2010  . Asthma attack 12/03/2010  . Anemia 12/03/2010  . Hyperglycemia 12/03/2010    Past Surgical History:  Procedure Laterality Date  . COLONOSCOPY N/A 07/11/2012   Procedure: COLONOSCOPY;  Surgeon: Rogene Houston, MD;  Location: [facility];  Service: Endoscopy;  Laterality: N/A;  730  . MASS EXCISION Right 06/07/2016   Procedure: EXCISION 1.5 CM MASS RIGHT THIGH;  Surgeon: Aviva Signs, MD;  Location: [facility];  Service: General;  Laterality: Right;  . TUBAL LIGATION       OB History   None      Home Medications    Prior to Admission medications   Medication Sig Start Date End Date Taking? Authorizing Provider  albuterol (PROVENTIL) (5 MG/ML) 0.5% nebulizer solution Take 2.5 mg by  nebulization as needed. 12/05/10 07/11/12  Kathie Dike, MD  fluticasone (FLOVENT HFA) 220 MCG/ACT inhaler Inhale 2 puffs into the lungs as needed. 12/05/10 12/05/11  Kathie Dike, MD  HYDROcodone-acetaminophen (NORCO) 5-325 MG tablet Take 1 tablet by mouth every 4 (four) hours as needed for moderate pain. 06/07/16   Aviva Signs, MD  ibuprofen (ADVIL,MOTRIN) 200 MG tablet Take 400 mg by mouth every 6 (six) hours as needed for mild pain.    [provider]  Multiple Vitamin (MULTIVITAMIN WITH MINERALS) TABS tablet Take 1 tablet by mouth daily.    [provider]  predniSONE (DELTASONE) 20 MG tablet Take 2 tablets (40 mg total) by mouth daily with breakfast. For the next four days 10/17/17   Carmin Muskrat, MD    Family History No family history on file.  Social History Social History   Tobacco Use  . Smoking status: Never Smoker  . Smokeless tobacco: Never Used  Substance Use Topics  . Alcohol use: No  . Drug use: No     Allergies   Gabapentin   Review of Systems Review of Systems  Constitutional:       Per HPI, otherwise negative  HENT:       Per HPI, otherwise negative  Respiratory:       Per HPI, otherwise negative  Cardiovascular:       Per HPI, otherwise negative  Gastrointestinal: Negative for vomiting.  Endocrine:  Negative aside from HPI  Genitourinary:       Neg aside from HPI   Musculoskeletal:       Per HPI, otherwise negative  Skin: Negative.   Neurological: Negative for syncope.     Physical Exam Updated Vital Signs BP (!) 149/65   Pulse 72   Temp 99 F (37.2 C) (Oral)   Resp 19   Wt 99.8 kg   LMP 09/17/2017   SpO2 100%   BMI 42.97 kg/m   Physical Exam  Constitutional: She is oriented to person, place, and time. She appears well-developed and well-nourished. No distress.  HENT:  Head: Normocephalic and atraumatic.  Eyes: Conjunctivae and EOM are normal.  Cardiovascular: Normal rate and regular rhythm.    Pulmonary/Chest: Effort normal. She has decreased breath sounds.  Abdominal: She exhibits no distension.  Musculoskeletal: She exhibits no edema.  Neurological: She is alert and oriented to person, place, and time. No cranial nerve deficit.  Skin: Skin is warm and dry.  Psychiatric: She has a normal mood and affect.  Nursing note and vitals reviewed.    ED Treatments / Results  Labs (all labs ordered are listed, but only abnormal results are displayed) Labs Reviewed  BASIC METABOLIC PANEL - Abnormal; Notable for the following components:      Result Value   Glucose, Bld 104 (*)    All other components within normal limits  CBC - Abnormal; Notable for the following components:   Hemoglobin 9.7 (*)    HCT 33.3 (*)    MCV 69.2 (*)    MCH 20.2 (*)    MCHC 29.1 (*)    RDW 19.3 (*)    All other components within normal limits  I-STAT TROPONIN, ED  I-STAT BETA HCG BLOOD, ED ([facility], [facility], [facility] ONLY)    EKG EKG Interpretation  Date/Time:  Wednesday October 17 2017 13:09:38 EDT Ventricular Rate:  73 PR Interval:  146 QRS Duration: 120 QT Interval:  430 QTC Calculation: 473 R Axis:   22 Text Interpretation:  Normal sinus rhythm Right bundle branch block No significant change since last tracing Abnormal ekg Confirmed by Carmin Muskrat [phone]) on 10/17/2017 2:14:35 PM   Radiology Dg Chest 2 View  Result Date: 10/17/2017 CLINICAL DATA:  Shortness of breath, chest pressure and heaviness 4 days, asthma, hypertension EXAM: CHEST - 2 VIEW COMPARISON:  12/03/2010 FINDINGS: Borderline enlargement of cardiac silhouette. Slight pulmonary vascular congestion. Mediastinal contours normal. Lungs clear. No pulmonary infiltrate, pleural effusion or pneumothorax. Osseous structures unremarkable. IMPRESSION: No acute abnormalities. Electronically Signed   By: Lavonia Dana M.D.   On: 10/17/2017 13:59    Procedures Procedures (including critical care time)  Medications Ordered in ED Medications   predniSONE (DELTASONE) tablet 60 mg (has no administration in time range)  albuterol (PROVENTIL) (2.5 MG/3ML) 0.083% nebulizer solution 5 mg (5 mg Nebulization Given 10/17/17 1306)     Initial Impression / Assessment and Plan / ED Course  I have reviewed the triage vital signs and the nursing notes.  Pertinent labs & imaging results that were available during my care of the patient were reviewed by me and considered in my medical decision making (see chart for details).    After initial evaluation I reviewed the patient's EKG from the office, and from 5 years ago.  When she has developed a new right bundle branch block, but there are no other notable changes, and no evidence for ischemia. We discussed today's findings including reassuring EKG in terms  of ischemia, reassuring labs, no elevated troponin, and reassuring x-ray. Suspicion for asthma exacerbation given the patient's description of likely precipitant, tightness, improvement with albuterol. Patient will start a course of steroids, scheduled albuterol sessions, follow-up with primary care.   Final Clinical Impressions(s) / ED Diagnoses   Final diagnoses:  Atypical chest pain    ED Discharge Orders         Ordered    predniSONE (DELTASONE) 20 MG tablet  Daily with breakfast     10/17/17 1512           Carmin Muskrat, MD 10/17/17 1514

## 2017-10-17 NOTE — Discharge Instructions (Signed)
As discussed, your evaluation today has been largely reassuring.  But, it is important that you monitor your condition carefully, and do not hesitate to return to the ED if you develop new, or concerning changes in your condition. ? ?Otherwise, please follow-up with your physician for appropriate ongoing care. ? ?

## 2017-10-17 NOTE — ED Notes (Signed)
Patient transported to X-ray 

## 2017-10-17 NOTE — ED Triage Notes (Addendum)
PT reports she has had issues with SOB and chest pressure since Sunday. PT has a history of asthma and has used her nebulizer without relief. PT saw her PCP yesterday and was advised to go to the ED. PT waited until today to report to ED. PT speaking in full sentences without difficulty.   PT's PCP advised her to go to ED for EKG changes. PT has two EKGs in hand from office visit yesterday.

## 2017-10-23 DIAGNOSIS — Z6841 Body Mass Index (BMI) 40.0 and over, adult: Secondary | ICD-10-CM | POA: Diagnosis not present

## 2017-10-23 DIAGNOSIS — Z1389 Encounter for screening for other disorder: Secondary | ICD-10-CM | POA: Diagnosis not present

## 2017-10-23 DIAGNOSIS — R0789 Other chest pain: Secondary | ICD-10-CM | POA: Diagnosis not present

## 2017-10-23 DIAGNOSIS — J45901 Unspecified asthma with (acute) exacerbation: Secondary | ICD-10-CM | POA: Diagnosis not present

## 2017-10-23 DIAGNOSIS — R0600 Dyspnea, unspecified: Secondary | ICD-10-CM | POA: Diagnosis not present

## 2017-10-25 ENCOUNTER — Other Ambulatory Visit (HOSPITAL_COMMUNITY): Payer: Self-pay | Admitting: Family Medicine

## 2017-10-25 DIAGNOSIS — Z1231 Encounter for screening mammogram for malignant neoplasm of breast: Secondary | ICD-10-CM

## 2017-11-26 DIAGNOSIS — J069 Acute upper respiratory infection, unspecified: Secondary | ICD-10-CM | POA: Diagnosis not present

## 2017-11-26 DIAGNOSIS — Z6841 Body Mass Index (BMI) 40.0 and over, adult: Secondary | ICD-10-CM | POA: Diagnosis not present

## 2017-11-26 DIAGNOSIS — J4520 Mild intermittent asthma, uncomplicated: Secondary | ICD-10-CM | POA: Diagnosis not present

## 2017-12-13 ENCOUNTER — Ambulatory Visit (HOSPITAL_COMMUNITY): Payer: 59

## 2017-12-20 ENCOUNTER — Ambulatory Visit (HOSPITAL_COMMUNITY): Payer: 59

## 2017-12-20 ENCOUNTER — Ambulatory Visit (HOSPITAL_COMMUNITY)
Admission: RE | Admit: 2017-12-20 | Discharge: 2017-12-20 | Disposition: A | Discharge status: Home / Self Care | Payer: 59 | Source: Ambulatory Visit | Attending: Family Medicine | Admitting: Family Medicine

## 2017-12-20 DIAGNOSIS — Z1231 Encounter for screening mammogram for malignant neoplasm of breast: Secondary | ICD-10-CM | POA: Insufficient documentation

## 2018-01-30 ENCOUNTER — Other Ambulatory Visit: Payer: Self-pay

## 2018-01-30 ENCOUNTER — Ambulatory Visit (HOSPITAL_COMMUNITY)
Admission: EM | Admit: 2018-01-30 | Discharge: 2018-01-30 | Disposition: A | Discharge status: Home / Self Care | Payer: 59 | Attending: Family Medicine | Admitting: Family Medicine

## 2018-01-30 ENCOUNTER — Encounter (HOSPITAL_COMMUNITY): Payer: Self-pay | Admitting: Emergency Medicine

## 2018-01-30 DIAGNOSIS — D259 Leiomyoma of uterus, unspecified: Secondary | ICD-10-CM | POA: Diagnosis not present

## 2018-01-30 DIAGNOSIS — N939 Abnormal uterine and vaginal bleeding, unspecified: Secondary | ICD-10-CM

## 2018-01-30 DIAGNOSIS — R42 Dizziness and giddiness: Secondary | ICD-10-CM | POA: Diagnosis not present

## 2018-01-30 DIAGNOSIS — N898 Other specified noninflammatory disorders of vagina: Secondary | ICD-10-CM | POA: Diagnosis not present

## 2018-01-30 DIAGNOSIS — Z888 Allergy status to other drugs, medicaments and biological substances status: Secondary | ICD-10-CM | POA: Diagnosis not present

## 2018-01-30 DIAGNOSIS — J45909 Unspecified asthma, uncomplicated: Secondary | ICD-10-CM | POA: Insufficient documentation

## 2018-01-30 DIAGNOSIS — I10 Essential (primary) hypertension: Secondary | ICD-10-CM | POA: Insufficient documentation

## 2018-01-30 DIAGNOSIS — Z79899 Other long term (current) drug therapy: Secondary | ICD-10-CM | POA: Insufficient documentation

## 2018-01-30 LAB — POCT URINALYSIS DIP (DEVICE)
BILIRUBIN URINE: NEGATIVE
GLUCOSE, UA: NEGATIVE mg/dL
KETONES UR: NEGATIVE mg/dL
Nitrite: NEGATIVE
PH: 5.5 (ref 5.0–8.0)
PROTEIN: NEGATIVE mg/dL
SPECIFIC GRAVITY, URINE: 1.02 (ref 1.005–1.030)
UROBILINOGEN UA: 0.2 mg/dL (ref 0.0–1.0)

## 2018-01-30 LAB — POCT I-STAT, CHEM 8
BUN: 14 mg/dL (ref 6–20)
Calcium, Ion: 1.17 mmol/L (ref 1.15–1.40)
Chloride: 106 mmol/L (ref 98–111)
Creatinine, Ser: 0.6 mg/dL (ref 0.44–1.00)
Glucose, Bld: 100 mg/dL — ABNORMAL HIGH (ref 70–99)
HCT: 25 % — ABNORMAL LOW (ref 36.0–46.0)
Hemoglobin: 8.5 g/dL — ABNORMAL LOW (ref 12.0–15.0)
Potassium: 4.2 mmol/L (ref 3.5–5.1)
SODIUM: 136 mmol/L (ref 135–145)
TCO2: 24 mmol/L (ref 22–32)

## 2018-01-30 LAB — POCT PREGNANCY, URINE: Preg Test, Ur: NEGATIVE

## 2018-01-30 MED ORDER — MEGESTROL ACETATE 40 MG PO TABS: 40.0000 mg | ORAL_TABLET | Freq: Two times a day (BID) | ORAL | 0 refills | Status: AC

## 2018-01-30 NOTE — Discharge Instructions (Addendum)
Start megace as directed. Keep hydrated, your urine should be clear to pale yellow in color. Continue iron supplements and leafy greens. Please follow up with your GYN as soon as possible for further evaluation and management needed. If having worsening dizziness, lightheadedness, passing out, continued bleeding, please go to the emergency department for further evaluation needed.

## 2018-01-30 NOTE — ED Provider Notes (Signed)
Melanie Boyd    CSN: [id number] Arrival date & time: 01/30/18  1545     History   Chief Complaint Chief Complaint  Patient presents with  . Vaginal Bleeding    HPI Melanie Boyd is a 47 y.o. female.   47 year old female with history of fibroid comes in for 3 week history of abnormal uterine bleeding. Normal cycle is 8 days long. She wears long over night pads, and usually goes through 12-15 pads per day. States after last cycle, she continued with bleeding, and flow has been waxing and waning. She states some days, it can go up to 20-25 pads/day and passing clots. Some days, she uses about 5 pads/day. Because she had some slow days, she thought bleeding will stop, but has continued. She has some right abdominal pain when she was passing clots, but has since resolved. She denies nausea/vomiting.  Denies urinary symptom such as frequency, dysuria, hematuria.  States she has been trying to take more leafy greens, iron, eating liver.  However, now with lightheadedness and dizziness.  She denies syncope.  States now occasionally, has had mild vaginal discharge as well.  No vaginal itching, pain.     Past Medical History:  Diagnosis Date  . Anemia   . Asthma   . Hypertension   . Shortness of breath     Patient Active Problem List   Diagnosis Date Noted  . Sebaceous cyst   . Abdominal pain 05/30/2012  . Nausea with vomiting 05/30/2012  . Bronchitis, acute 12/05/2010  . Asthma attack 12/03/2010  . Anemia 12/03/2010  . Hyperglycemia 12/03/2010    Past Surgical History:  Procedure Laterality Date  . COLONOSCOPY N/A 07/11/2012   Procedure: COLONOSCOPY;  Surgeon: Rogene Houston, MD;  Location: [facility];  Service: Endoscopy;  Laterality: N/A;  730  . MASS EXCISION Right 06/07/2016   Procedure: EXCISION 1.5 CM MASS RIGHT THIGH;  Surgeon: Aviva Signs, MD;  Location: [facility];  Service: General;  Laterality: Right;  . TUBAL LIGATION      OB History   None       Home Medications    Prior to Admission medications   Medication Sig Start Date End Date Taking? Authorizing Provider  albuterol (PROVENTIL) (5 MG/ML) 0.5% nebulizer solution Take 2.5 mg by nebulization as needed. 12/05/10 07/11/12  Kathie Dike, MD  fluticasone (FLOVENT HFA) 220 MCG/ACT inhaler Inhale 2 puffs into the lungs as needed. 12/05/10 12/05/11  Kathie Dike, MD  megestrol (MEGACE) 40 MG tablet Take 1 tablet (40 mg total) by mouth 2 (two) times daily for 15 days. 01/30/18 02/14/18  Ok Edwards, PA-C  Multiple Vitamin (MULTIVITAMIN WITH MINERALS) TABS tablet Take 1 tablet by mouth daily.    [provider]    Family History History reviewed. No pertinent family history.  Social History Social History   Tobacco Use  . Smoking status: Never Smoker  . Smokeless tobacco: Never Used  Substance Use Topics  . Alcohol use: No  . Drug use: No     Allergies   Gabapentin   Review of Systems Review of Systems  Reason unable to perform ROS: See HPI as above.     Physical Exam Triage Vital Signs ED Triage Vitals  Enc Vitals Group     BP 01/30/18 1634 (!) 147/67     Pulse Rate 01/30/18 1634 82     Resp 01/30/18 1634 20     Temp 01/30/18 1634 97.9 F (36.6 C)  Temp Source 01/30/18 1634 Oral     SpO2 01/30/18 1634 100 %     Weight --      Height --      Head Circumference --      Peak Flow --      Pain Score 01/30/18 1632 2     Pain Loc --      Pain Edu? --      Excl. in [place]? --    No data found.  Updated Vital Signs BP (!) 147/67 (BP Location: Right Arm) Comment (BP Location): large cuff  Pulse 82   Temp 97.9 F (36.6 C) (Oral)   Resp 20   SpO2 100%   Physical Exam  Constitutional: She is oriented to person, place, and time. She appears well-developed and well-nourished. No distress.  HENT:  Head: Normocephalic and atraumatic.  Eyes: Pupils are equal, round, and reactive to light. Conjunctivae are normal.  Cardiovascular: Normal rate,  regular rhythm and normal heart sounds. Exam reveals no gallop and no friction rub.  No murmur heard. Pulmonary/Chest: Effort normal and breath sounds normal. She has no wheezes. She has no rales.  Abdominal: Soft. Bowel sounds are normal. She exhibits no mass. There is no tenderness. There is no rebound, no guarding and no CVA tenderness.  Neurological: She is alert and oriented to person, place, and time.  Skin: Skin is warm and dry. Capillary refill takes 2 to 3 seconds. There is pallor.  Psychiatric: She has a normal mood and affect. Her behavior is normal. Judgment normal.     UC Treatments / Results  Labs (all labs ordered are listed, but only abnormal results are displayed) Labs Reviewed  POCT URINALYSIS DIP (DEVICE) - Abnormal; Notable for the following components:      Result Value   Hgb urine dipstick LARGE (*)    Leukocytes, UA TRACE (*)    All other components within normal limits  POCT I-STAT, CHEM 8 - Abnormal; Notable for the following components:   Glucose, Bld 100 (*)    Hemoglobin 8.5 (*)    HCT 25.0 (*)    All other components within normal limits  POC URINE PREG, ED  I-STAT CHEM 8, ED  POCT PREGNANCY, URINE  CERVICOVAGINAL ANCILLARY ONLY    EKG None  Radiology No results found.  Procedures Procedures (including critical care time)  Medications Ordered in UC Medications - No data to display  Initial Impression / Assessment and Plan / UC Course  I have reviewed the triage vital signs and the nursing notes.  Pertinent labs & imaging results that were available during my care of the patient were reviewed by me and considered in my medical decision making (see chart for details).    Hemoglobin 8.5 on i-STAT, 9.7 on CBC 10/17/2017.  Patient able to ambulate on own without difficulty.  Steady gait.  Although patient with pallor, slightly slow cap refill, she is currently stable, and reasonable to try Megace to stop bleeding, and have patient follow-up for  further evaluation with GYN.  Push fluids.  Strict return precautions given.  Patient expresses understanding and agrees to plan.  Final Clinical Impressions(s) / UC Diagnoses   Final diagnoses:  Abnormal uterine bleeding (AUB)    ED Prescriptions    Medication Sig Dispense Auth. Provider   megestrol (MEGACE) 40 MG tablet Take 1 tablet (40 mg total) by mouth 2 (two) times daily for 15 days. 30 tablet Ok Edwards, PA-C  Ok Edwards, PA-C 01/30/18 [phone]

## 2018-01-30 NOTE — ED Triage Notes (Signed)
Intermittent vaginal bleeding for 3 weeks.  Patient reports seeing clots.

## 2018-02-01 LAB — CERVICOVAGINAL ANCILLARY ONLY
Bacterial vaginitis: NEGATIVE
Candida vaginitis: NEGATIVE
Chlamydia: NEGATIVE
Neisseria Gonorrhea: NEGATIVE
Trichomonas: NEGATIVE

## 2018-04-04 DIAGNOSIS — N921 Excessive and frequent menstruation with irregular cycle: Secondary | ICD-10-CM | POA: Diagnosis not present

## 2018-04-04 DIAGNOSIS — Z124 Encounter for screening for malignant neoplasm of cervix: Secondary | ICD-10-CM | POA: Diagnosis not present

## 2018-04-04 DIAGNOSIS — Z1389 Encounter for screening for other disorder: Secondary | ICD-10-CM | POA: Diagnosis not present

## 2018-04-04 DIAGNOSIS — Z0001 Encounter for general adult medical examination with abnormal findings: Secondary | ICD-10-CM | POA: Diagnosis not present

## 2018-04-04 DIAGNOSIS — R030 Elevated blood-pressure reading, without diagnosis of hypertension: Secondary | ICD-10-CM | POA: Diagnosis not present

## 2018-04-04 DIAGNOSIS — Z0000 Encounter for general adult medical examination without abnormal findings: Secondary | ICD-10-CM | POA: Diagnosis not present

## 2018-04-04 DIAGNOSIS — Z6841 Body Mass Index (BMI) 40.0 and over, adult: Secondary | ICD-10-CM | POA: Diagnosis not present

## 2018-05-21 DIAGNOSIS — B349 Viral infection, unspecified: Secondary | ICD-10-CM | POA: Diagnosis not present

## 2018-05-21 DIAGNOSIS — Z681 Body mass index (BMI) 19 or less, adult: Secondary | ICD-10-CM | POA: Diagnosis not present

## 2018-05-21 DIAGNOSIS — J069 Acute upper respiratory infection, unspecified: Secondary | ICD-10-CM | POA: Diagnosis not present

## 2018-05-23 ENCOUNTER — Ambulatory Visit: Payer: 59 | Admitting: Obstetrics & Gynecology

## 2018-05-27 ENCOUNTER — Ambulatory Visit (HOSPITAL_COMMUNITY)
Admission: RE | Admit: 2018-05-27 | Discharge: 2018-05-27 | Disposition: A | Discharge status: Home / Self Care | Payer: 59 | Source: Ambulatory Visit | Attending: Internal Medicine | Admitting: Internal Medicine

## 2018-05-27 ENCOUNTER — Other Ambulatory Visit: Payer: Self-pay

## 2018-05-27 ENCOUNTER — Other Ambulatory Visit (HOSPITAL_COMMUNITY): Payer: Self-pay | Admitting: Internal Medicine

## 2018-05-27 DIAGNOSIS — R05 Cough: Secondary | ICD-10-CM

## 2018-05-27 DIAGNOSIS — J45901 Unspecified asthma with (acute) exacerbation: Secondary | ICD-10-CM | POA: Diagnosis not present

## 2018-05-27 DIAGNOSIS — E669 Obesity, unspecified: Secondary | ICD-10-CM | POA: Diagnosis not present

## 2018-05-27 DIAGNOSIS — R509 Fever, unspecified: Secondary | ICD-10-CM | POA: Diagnosis not present

## 2018-05-27 DIAGNOSIS — R059 Cough, unspecified: Secondary | ICD-10-CM

## 2018-05-27 DIAGNOSIS — Z1389 Encounter for screening for other disorder: Secondary | ICD-10-CM | POA: Diagnosis not present

## 2018-05-27 DIAGNOSIS — Z681 Body mass index (BMI) 19 or less, adult: Secondary | ICD-10-CM | POA: Diagnosis not present

## 2018-09-24 DIAGNOSIS — Z20828 Contact with and (suspected) exposure to other viral communicable diseases: Secondary | ICD-10-CM | POA: Diagnosis not present

## 2018-10-10 DIAGNOSIS — R509 Fever, unspecified: Secondary | ICD-10-CM | POA: Diagnosis not present

## 2018-10-10 DIAGNOSIS — R609 Edema, unspecified: Secondary | ICD-10-CM | POA: Diagnosis not present

## 2018-10-11 ENCOUNTER — Emergency Department (HOSPITAL_COMMUNITY): Payer: 59

## 2018-10-11 ENCOUNTER — Encounter (HOSPITAL_COMMUNITY): Payer: Self-pay | Admitting: Emergency Medicine

## 2018-10-11 ENCOUNTER — Other Ambulatory Visit: Payer: Self-pay

## 2018-10-11 ENCOUNTER — Emergency Department (HOSPITAL_COMMUNITY)
Admission: EM | Admit: 2018-10-11 | Discharge: 2018-10-11 | Disposition: A | Discharge status: Home / Self Care | Payer: 59 | Attending: Emergency Medicine | Admitting: Emergency Medicine

## 2018-10-11 DIAGNOSIS — R600 Localized edema: Secondary | ICD-10-CM | POA: Insufficient documentation

## 2018-10-11 DIAGNOSIS — R0602 Shortness of breath: Secondary | ICD-10-CM | POA: Diagnosis not present

## 2018-10-11 DIAGNOSIS — I10 Essential (primary) hypertension: Secondary | ICD-10-CM | POA: Insufficient documentation

## 2018-10-11 DIAGNOSIS — Z20828 Contact with and (suspected) exposure to other viral communicable diseases: Secondary | ICD-10-CM | POA: Diagnosis not present

## 2018-10-11 DIAGNOSIS — J45909 Unspecified asthma, uncomplicated: Secondary | ICD-10-CM | POA: Insufficient documentation

## 2018-10-11 DIAGNOSIS — R509 Fever, unspecified: Secondary | ICD-10-CM | POA: Diagnosis not present

## 2018-10-11 DIAGNOSIS — R05 Cough: Secondary | ICD-10-CM | POA: Diagnosis not present

## 2018-10-11 LAB — COMPREHENSIVE METABOLIC PANEL
ALT: 32 U/L (ref 0–44)
AST: 18 U/L (ref 15–41)
Albumin: 3.9 g/dL (ref 3.5–5.0)
Alkaline Phosphatase: 75 U/L (ref 38–126)
Anion gap: 10 (ref 5–15)
BUN: 11 mg/dL (ref 6–20)
CO2: 24 mmol/L (ref 22–32)
Calcium: 9 mg/dL (ref 8.9–10.3)
Chloride: 106 mmol/L (ref 98–111)
Creatinine, Ser: 0.43 mg/dL — ABNORMAL LOW (ref 0.44–1.00)
GFR calc Af Amer: >60 mL/min (ref >60)
GFR calc non Af Amer: >60 mL/min (ref >60)
Glucose, Bld: 100 mg/dL — ABNORMAL HIGH (ref 70–99)
Potassium: 3.8 mmol/L (ref 3.5–5.1)
Sodium: 140 mmol/L (ref 135–145)
Total Bilirubin: 0.5 mg/dL (ref 0.3–1.2)
Total Protein: 7.2 g/dL (ref 6.5–8.1)

## 2018-10-11 LAB — CBC WITH DIFFERENTIAL/PLATELET
Abs Immature Granulocytes: 0.03 10*3/uL (ref 0.00–0.07)
Basophils Absolute: 0 10*3/uL (ref 0.0–0.1)
Basophils Relative: 1 %
Eosinophils Absolute: 0.1 10*3/uL (ref 0.0–0.5)
Eosinophils Relative: 1 %
HCT: 38 % (ref 36.0–46.0)
Hemoglobin: 11.7 g/dL — ABNORMAL LOW (ref 12.0–15.0)
Immature Granulocytes: 0 %
Lymphocytes Relative: 18 %
Lymphs Abs: 1.5 10*3/uL (ref 0.7–4.0)
MCH: 23.8 pg — ABNORMAL LOW (ref 26.0–34.0)
MCHC: 30.8 g/dL (ref 30.0–36.0)
MCV: 77.2 fL — ABNORMAL LOW (ref 80.0–100.0)
Monocytes Absolute: 0.5 10*3/uL (ref 0.1–1.0)
Monocytes Relative: 6 %
Neutro Abs: 6 10*3/uL (ref 1.7–7.7)
Neutrophils Relative %: 74 %
Platelets: 255 10*3/uL (ref 150–400)
RBC: 4.92 MIL/uL (ref 3.87–5.11)
RDW: 16.2 % — ABNORMAL HIGH (ref 11.5–15.5)
WBC: 8.1 10*3/uL (ref 4.0–10.5)
nRBC: 0 % (ref 0.0–0.2)

## 2018-10-11 LAB — TROPONIN I (HIGH SENSITIVITY)
Troponin I (High Sensitivity): 3 ng/L (ref <18)
Troponin I (High Sensitivity): 3 ng/L (ref <18)

## 2018-10-11 LAB — D-DIMER, QUANTITATIVE: D-Dimer, Quant: 0.28 ug/mL-FEU (ref 0.00–0.50)

## 2018-10-11 LAB — HCG, SERUM, QUALITATIVE: Preg, Serum: NEGATIVE

## 2018-10-11 LAB — SARS CORONAVIRUS 2 BY RT PCR (HOSPITAL ORDER, PERFORMED IN ~~LOC~~ HOSPITAL LAB): SARS Coronavirus 2: NEGATIVE

## 2018-10-11 LAB — BRAIN NATRIURETIC PEPTIDE: B Natriuretic Peptide: 126 pg/mL — ABNORMAL HIGH (ref 0.0–100.0)

## 2018-10-11 MED ORDER — FUROSEMIDE 20 MG PO TABS: ORAL_TABLET | ORAL | 0 refills | Status: DC

## 2018-10-11 MED ORDER — FUROSEMIDE 40 MG PO TABS
20.0000 mg | ORAL_TABLET | Freq: Once | ORAL | Status: AC
  Administered 2018-10-11: 20 mg via ORAL
  Filled 2018-10-11: qty 1

## 2018-10-11 NOTE — ED Triage Notes (Addendum)
Pt c/o SOB, cough, fever, sore throat, & vomiting & diarrhea x 3 weeks. Pt states she has had 2 negative COVID tests within that time, the last one was 4 days ago. Pt denies [place] contacts. Pt reports that past 3 nights she has increased SOB while lying back. Pt with audible wheezes. Pt also reports edema to abdomen and lower extremities.

## 2018-10-11 NOTE — Discharge Instructions (Signed)
Follow-up with Dr. Bronson Ing or 1 of his partners in the next couple weeks.

## 2018-10-11 NOTE — ED Provider Notes (Signed)
[facility] EMERGENCY DEPARTMENT Provider Note   CSN: [id number] Arrival date & time: 10/11/18  [id number]     History   Chief Complaint Chief Complaint  Patient presents with  . Shortness of Breath    HPI Melanie Boyd is a 48 y.o. female.     Patient complains of shortness of breath for weeks.  She also complains of fluid in her legs.  The history is provided by the patient. No language interpreter was used.  Shortness of Breath Severity:  Mild Onset quality:  Sudden Timing:  Constant Progression:  Worsening Chronicity:  New Context: not activity   Associated symptoms: no abdominal pain, no chest pain, no cough, no headaches and no rash     Past Medical History:  Diagnosis Date  . Anemia   . Asthma   . Hypertension   . Shortness of breath     Patient Active Problem List   Diagnosis Date Noted  . Sebaceous cyst   . Abdominal pain 05/30/2012  . Nausea with vomiting 05/30/2012  . Bronchitis, acute 12/05/2010  . Asthma attack 12/03/2010  . Anemia 12/03/2010  . Hyperglycemia 12/03/2010    Past Surgical History:  Procedure Laterality Date  . COLONOSCOPY N/A 07/11/2012   Procedure: COLONOSCOPY;  Surgeon: Rogene Houston, MD;  [facility]: [facility];  Service: Endoscopy;  Laterality: N/A;  730  . MASS EXCISION Right 06/07/2016   Procedure: EXCISION 1.5 CM MASS RIGHT THIGH;  Surgeon: Aviva Signs, MD;  Location: [facility] ORS;  Service: General;  Laterality: Right;  . TUBAL LIGATION       OB History   No obstetric history on file.      Home Medications    Prior to Admission medications   Medication Sig Start Date End Date Taking? Authorizing Provider  albuterol (PROVENTIL) (5 MG/ML) 0.5% nebulizer solution Take 2.5 mg by nebulization as needed. 12/05/10 10/11/18 Yes Kathie Dike, MD  furosemide (LASIX) 20 MG tablet Take 1/2 of a pill daily 10/11/18   Milton Ferguson, MD    Family History No family history on file.  Social History Social History   Tobacco  Use  . Smoking status: Never Smoker  . Smokeless tobacco: Never Used  Substance Use Topics  . Alcohol use: No  . Drug use: No     Allergies   Gabapentin   Review of Systems Review of Systems  Constitutional: Negative for appetite change and fatigue.  HENT: Negative for congestion, ear discharge and sinus pressure.   Eyes: Negative for discharge.  Respiratory: Positive for shortness of breath. Negative for cough.   Cardiovascular: Negative for chest pain.  Gastrointestinal: Negative for abdominal pain and diarrhea.  Genitourinary: Negative for frequency and hematuria.  Musculoskeletal: Negative for back pain.  Skin: Negative for rash.  Neurological: Negative for seizures and headaches.  Psychiatric/Behavioral: Negative for hallucinations.     Physical Exam Updated Vital Signs BP (!) 168/65   Pulse 73   Temp 98.1 F (36.7 C) (Oral)   Resp (!) 28   Ht 5\' 1"  (1.549 m)   Wt 88.5 kg   SpO2 100%   BMI 36.84 kg/m   Physical Exam Vitals signs and nursing note reviewed.  Constitutional:      Appearance: She is well-developed.  HENT:     Head: Normocephalic.     Nose: Nose normal.  Eyes:     General: No scleral icterus.    Conjunctiva/sclera: Conjunctivae normal.  Neck:     Musculoskeletal: Neck supple.  Thyroid: No thyromegaly.  Cardiovascular:     Rate and Rhythm: Normal rate and regular rhythm.     Heart sounds: No murmur. No friction rub. No gallop.   Pulmonary:     Breath sounds: No stridor. No wheezing or rales.  Chest:     Chest wall: No tenderness.  Abdominal:     General: There is no distension.     Tenderness: There is no abdominal tenderness. There is no rebound.  Musculoskeletal: Normal range of motion.     Comments: Bilateral edema  Lymphadenopathy:     Cervical: No cervical adenopathy.  Skin:    Findings: No erythema or rash.  Neurological:     Mental Status: She is oriented to person, place, and time.     Motor: No abnormal muscle tone.      Coordination: Coordination normal.  Psychiatric:        Behavior: Behavior normal.      ED Treatments / Results  Labs (all labs ordered are listed, but only abnormal results are displayed) Labs Reviewed  CBC WITH DIFFERENTIAL/PLATELET - Abnormal; Notable for the following components:      Result Value   Hemoglobin 11.7 (*)    MCV 77.2 (*)    MCH 23.8 (*)    RDW 16.2 (*)    All other components within normal limits  COMPREHENSIVE METABOLIC PANEL - Abnormal; Notable for the following components:   Glucose, Bld 100 (*)    Creatinine, Ser 0.43 (*)    All other components within normal limits  BRAIN NATRIURETIC PEPTIDE - Abnormal; Notable for the following components:   B Natriuretic Peptide 126.0 (*)    All other components within normal limits  SARS CORONAVIRUS 2 (HOSPITAL ORDER, [place])  D-DIMER, QUANTITATIVE (NOT AT [facility])  HCG, SERUM, QUALITATIVE  TROPONIN I (HIGH SENSITIVITY)  TROPONIN I (HIGH SENSITIVITY)    EKG EKG Interpretation  Date/Time:  Friday October 11 2018 09:46:02 EDT Ventricular Rate:  78 PR Interval:    QRS Duration: 141 QT Interval:  438 QTC Calculation: 499 R Axis:   -11 Text Interpretation:  Sinus rhythm Right bundle branch block Baseline wander in lead(s) III aVF Confirmed by Milton Ferguson [phone]) on 10/11/2018 10:00:13 AM Also confirmed by Milton Ferguson [phone])  on 10/11/2018 10:17:33 AM   Radiology Dg Chest Portable 1 View  Result Date: 10/11/2018 CLINICAL DATA:  Shortness of breath, cough, fever. EXAM: PORTABLE CHEST 1 VIEW COMPARISON:  Radiographs of May 27, 2018. FINDINGS: Mild cardiomegaly is noted. No pneumothorax or pleural effusion is noted. Both lungs are clear. The visualized skeletal structures are unremarkable. IMPRESSION: No active disease. Electronically Signed   By: Marijo Conception M.D.   On: 10/11/2018 11:33    Procedures Procedures (including critical care time)  Medications Ordered in ED  Medications  furosemide (LASIX) tablet 20 mg (has no administration in time range)     Initial Impression / Assessment and Plan / ED Course  I have reviewed the triage vital signs and the nursing notes.  Pertinent labs & imaging results that were available during my care of the patient were reviewed by me and considered in my medical decision making (see chart for details).        Labs unremarkable.  Patient with bilateral edema in her legs.  We will start her on a small dose of Lasix and have her follow-up with cardiology Final Clinical Impressions(s) / ED Diagnoses   Final diagnoses:  Shortness  of breath    ED Discharge Orders         Ordered    furosemide (LASIX) 20 MG tablet     10/11/18 1323           Milton Ferguson, MD 10/11/18 1328

## 2018-10-23 DIAGNOSIS — M79662 Pain in left lower leg: Secondary | ICD-10-CM | POA: Diagnosis not present

## 2018-10-23 DIAGNOSIS — R9431 Abnormal electrocardiogram [ECG] [EKG]: Secondary | ICD-10-CM | POA: Diagnosis not present

## 2018-10-23 DIAGNOSIS — M79661 Pain in right lower leg: Secondary | ICD-10-CM | POA: Diagnosis not present

## 2018-10-23 DIAGNOSIS — R0602 Shortness of breath: Secondary | ICD-10-CM | POA: Diagnosis not present

## 2018-10-31 DIAGNOSIS — J4520 Mild intermittent asthma, uncomplicated: Secondary | ICD-10-CM | POA: Diagnosis not present

## 2018-10-31 DIAGNOSIS — D509 Iron deficiency anemia, unspecified: Secondary | ICD-10-CM | POA: Diagnosis not present

## 2018-10-31 DIAGNOSIS — Z6841 Body Mass Index (BMI) 40.0 and over, adult: Secondary | ICD-10-CM | POA: Diagnosis not present

## 2018-10-31 DIAGNOSIS — R5383 Other fatigue: Secondary | ICD-10-CM | POA: Diagnosis not present

## 2018-11-14 DIAGNOSIS — M79605 Pain in left leg: Secondary | ICD-10-CM | POA: Diagnosis not present

## 2018-11-14 DIAGNOSIS — R0602 Shortness of breath: Secondary | ICD-10-CM | POA: Diagnosis not present

## 2018-11-14 DIAGNOSIS — M79604 Pain in right leg: Secondary | ICD-10-CM | POA: Diagnosis not present

## 2018-11-14 DIAGNOSIS — R069 Unspecified abnormalities of breathing: Secondary | ICD-10-CM | POA: Diagnosis not present

## 2018-11-14 DIAGNOSIS — R0989 Other specified symptoms and signs involving the circulatory and respiratory systems: Secondary | ICD-10-CM | POA: Diagnosis not present

## 2018-11-14 DIAGNOSIS — R9431 Abnormal electrocardiogram [ECG] [EKG]: Secondary | ICD-10-CM | POA: Diagnosis not present

## 2018-11-14 DIAGNOSIS — R609 Edema, unspecified: Secondary | ICD-10-CM | POA: Diagnosis not present

## 2018-11-22 DIAGNOSIS — R0602 Shortness of breath: Secondary | ICD-10-CM | POA: Diagnosis not present

## 2018-11-22 DIAGNOSIS — R9431 Abnormal electrocardiogram [ECG] [EKG]: Secondary | ICD-10-CM | POA: Diagnosis not present

## 2018-11-27 ENCOUNTER — Ambulatory Visit: Payer: 59 | Admitting: Cardiovascular Disease

## 2018-11-27 ENCOUNTER — Encounter

## 2018-11-28 ENCOUNTER — Encounter: Payer: Self-pay | Admitting: Cardiovascular Disease

## 2019-02-04 ENCOUNTER — Other Ambulatory Visit (HOSPITAL_COMMUNITY): Payer: Self-pay | Admitting: Physician Assistant

## 2019-02-04 DIAGNOSIS — Z1231 Encounter for screening mammogram for malignant neoplasm of breast: Secondary | ICD-10-CM

## 2019-02-05 ENCOUNTER — Ambulatory Visit (HOSPITAL_COMMUNITY)
Admission: RE | Admit: 2019-02-05 | Discharge: 2019-02-05 | Disposition: A | Discharge status: Home / Self Care | Payer: 59 | Source: Ambulatory Visit | Attending: Physician Assistant | Admitting: Physician Assistant

## 2019-02-05 ENCOUNTER — Other Ambulatory Visit: Payer: Self-pay

## 2019-02-05 DIAGNOSIS — Z1231 Encounter for screening mammogram for malignant neoplasm of breast: Secondary | ICD-10-CM | POA: Diagnosis not present

## 2019-02-12 ENCOUNTER — Ambulatory Visit (HOSPITAL_COMMUNITY): Payer: 59

## 2019-03-02 ENCOUNTER — Other Ambulatory Visit: Payer: Self-pay

## 2019-03-02 ENCOUNTER — Ambulatory Visit
Admission: EM | Admit: 2019-03-02 | Discharge: 2019-03-02 | Disposition: A | Discharge status: Home / Self Care | Payer: 59 | Attending: Emergency Medicine | Admitting: Emergency Medicine

## 2019-03-02 DIAGNOSIS — J4541 Moderate persistent asthma with (acute) exacerbation: Secondary | ICD-10-CM | POA: Diagnosis not present

## 2019-03-02 DIAGNOSIS — Z20822 Contact with and (suspected) exposure to covid-19: Secondary | ICD-10-CM

## 2019-03-02 DIAGNOSIS — R0602 Shortness of breath: Secondary | ICD-10-CM | POA: Diagnosis not present

## 2019-03-02 DIAGNOSIS — R05 Cough: Secondary | ICD-10-CM | POA: Diagnosis not present

## 2019-03-02 DIAGNOSIS — J029 Acute pharyngitis, unspecified: Secondary | ICD-10-CM

## 2019-03-02 DIAGNOSIS — R6889 Other general symptoms and signs: Secondary | ICD-10-CM

## 2019-03-02 MED ORDER — METHYLPREDNISOLONE SODIUM SUCC 125 MG IJ SOLR
125.0000 mg | Freq: Once | INTRAMUSCULAR | Status: AC
  Administered 2019-03-02: 125 mg via INTRAMUSCULAR

## 2019-03-02 MED ORDER — CETIRIZINE HCL 10 MG PO TABS: 10.0000 mg | ORAL_TABLET | Freq: Every day | ORAL | 0 refills | Status: DC

## 2019-03-02 MED ORDER — PREDNISONE 10 MG (21) PO TBPK: ORAL_TABLET | Freq: Every day | ORAL | 0 refills | Status: DC

## 2019-03-02 MED ORDER — FLUTICASONE PROPIONATE 50 MCG/ACT NA SUSP: 2.0000 | Freq: Every day | NASAL | 0 refills | Status: DC

## 2019-03-02 MED ORDER — BENZONATATE 100 MG PO CAPS: 100.0000 mg | ORAL_CAPSULE | Freq: Three times a day (TID) | ORAL | 0 refills | Status: DC

## 2019-03-02 NOTE — ED Provider Notes (Signed)
[place]   [id number] 03/02/19 Arrival Time: 1257   CC: Flu/ COVID symptoms; asthma  SUBJECTIVE: History from: patient.  Melanie Boyd is a 49 y.o. female hx significant for asthma and SOB, who presents with SOB, productive cough with yellow sputum, BA, few episodes of diarrhea (now resolved), few episodes of vomiting (now resolved), and chest tightness x 5 days.  Admits to positive COVID exposure to daughter 1 week ago.  Has tried asthma medication without relief.  Symptoms are made worse at night.  States she tested positive for COVID on 01/17/19 through health at work, but was asymptomatic at that time.  Complains of associated rhinorrhea, congestion, and sore throat.  Denies fever, chills, wheezing, chest pain, nausea, abdominal pain, changes in bladder habits.    ROS: As per HPI.  All other pertinent ROS negative.     Past Medical History:  Diagnosis Date  . Anemia   . Asthma   . Hypertension   . Shortness of breath    Past Surgical History:  Procedure Laterality Date  . COLONOSCOPY N/A 07/11/2012   Procedure: COLONOSCOPY;  Surgeon: Rogene Houston, MD;  [facility];  Service: Endoscopy;  Laterality: N/A;  730  . MASS EXCISION Right 06/07/2016   Procedure: EXCISION 1.5 CM MASS RIGHT THIGH;  Surgeon: Aviva Signs, MD;  Location: [facility] ORS;  Service: General;  Laterality: Right;  . TUBAL LIGATION     Allergies  Allergen Reactions  . Gabapentin     Vision loss, memory loss   No current facility-administered medications on file prior to encounter.   Current Outpatient Medications on File Prior to Encounter  Medication Sig Dispense Refill  . albuterol (PROVENTIL) (5 MG/ML) 0.5% nebulizer solution Take 2.5 mg by nebulization as needed.    . furosemide (LASIX) 20 MG tablet Take 1/2 of a pill daily 30 tablet 0   Social History   Socioeconomic History  . Marital status: Married    Spouse name: Not on file  . Number of children: Not on file  . Years  of education: Not on file  . Highest education level: Not on file  Occupational History  . Not on file  Tobacco Use  . Smoking status: Never Smoker  . Smokeless tobacco: Never Used  Substance and Sexual Activity  . Alcohol use: No  . Drug use: No  . Sexual activity: Yes    Birth control/protection: Surgical  Other Topics Concern  . Not on file  Social History Narrative  . Not on file   Social Determinants of Health   Financial Resource Strain:   . Difficulty of Paying Living Expenses: Not on file  Food Insecurity:   . Worried About Charity fundraiser in the Last Year: Not on file  . Ran Out of Food in the Last Year: Not on file  Transportation Needs:   . Lack of Transportation (Medical): Not on file  . Lack of Transportation (Non-Medical): Not on file  Physical Activity:   . Days of Exercise per Week: Not on file  . Minutes of Exercise per Session: Not on file  Stress:   . Feeling of Stress : Not on file  Social Connections:   . Frequency of Communication with Friends and Family: Not on file  . Frequency of Social Gatherings with Friends and Family: Not on file  . Attends Religious Services: Not on file  . Active Member of Clubs or Organizations: Not on file  . Attends Club or  Organization Meetings: Not on file  . Marital Status: Not on file  Intimate Partner Violence:   . Fear of Current or Ex-Partner: Not on file  . Emotionally Abused: Not on file  . Physically Abused: Not on file  . Sexually Abused: Not on file   Family History  Problem Relation Age of Onset  . Healthy Mother   . Healthy Father     OBJECTIVE:  Vitals:   03/02/19 1308  BP: (!) 148/79  Pulse: 84  Resp: (!) 34  Temp: 98.6 F (37 C)  TempSrc: Oral  SpO2: 97%     General appearance: alert; appears fatigued HEENT: NCAT; Ears: EACs clear, TMs pearly gray; Eyes: PERRL.  EOM grossly intact. Sinuses: nontender; Nose: nares patent without rhinorrhea, Throat: oropharynx clear, tonsils non  erythematous or enlarged, uvula midline  Neck: supple without LAD Lungs: symmetrical air entry; cough: absent; mild respiratory distress, labored respiration, speaking in incomplete sentences; mild decreased breath sounds at bilateral lung bases, but this due to body habitus Heart: regular rate and rhythm.   Extremities: no peripheral edema Skin: warm and dry Psychological: alert and cooperative; normal mood and affect  ASSESSMENT & PLAN:  1. Suspected COVID-19 virus infection   2. Moderate persistent asthma with acute exacerbation   3. Flu-like symptoms     Meds ordered this encounter  Medications  . predniSONE (STERAPRED UNI-PAK 21 TAB) 10 MG (21) TBPK tablet    Sig: Take by mouth daily. Take 6 tabs by mouth daily  for 2 days, then 5 tabs for 2 days, then 4 tabs for 2 days, then 3 tabs for 2 days, 2 tabs for 2 days, then 1 tab by mouth daily for 2 days    Dispense:  42 tablet    Refill:  0    Order Specific Question:   Supervising Provider    Answer:   Raylene Everts [id number]  . methylPREDNISolone sodium succinate (SOLU-MEDROL) 125 mg/2 mL injection 125 mg   Given labored respiration offered patient further evaluation and management in the ED.  Patient declines at this time and would like to try outpatient therapy first.  Given strict ED precautions.  Aware of risk associated with this decision including organ damage, failure and/or death.    We will treat for asthma exacerbation Steroid shot given in office Prednisone dose pak sent in to pharmacy.  Take as directed and to completion  COVID testing ordered.  It will take between 5-7 days for test results.  Someone will contact you regarding abnormal results.    In the meantime: You should remain isolated in your home for 10 days from symptom onset AND greater than 72 hours after symptoms resolution (absence of fever without the use of fever-reducing medication and improvement in respiratory symptoms), whichever is longer Get  plenty of rest and push fluids Tessalon Perles prescribed for cough Use OTC zyrtec for nasal congestion, runny nose, and/or sore throat Use OTC flonase for nasal congestion and runny nose Use medications daily for symptom relief Use OTC medications like ibuprofen or tylenol as needed fever or pain Follow up with PCP in 1-2 days for recheck and to ensure symptoms are improving Go to the ED if you have any new or worsening symptoms such as fever, worsening cough, worsening shortness of breath, chest tightness, chest pain, difficulty breathing, turning blue, changes in mental status, etc...   Reviewed expectations re: course of current medical issues. Questions answered. Outlined signs and symptoms indicating need for  more acute intervention. Patient verbalized understanding. After Visit Summary given.         Lestine Box, PA-C 03/02/19 1347

## 2019-03-02 NOTE — Discharge Instructions (Signed)
Given labored respiration offered patient further evaluation and management in the ED.  Patient declines at this time and would like to try outpatient therapy first.  Given strict ED precautions.  Aware of risk associated with this decision including organ damage, failure and/or death.    We will treat for asthma exacerbation Steroid shot given in office Prednisone dose pak sent in to pharmacy.  Take as directed and to completion  COVID testing ordered.  It will take between 5-7 days for test results.  Someone will contact you regarding abnormal results.    In the meantime: You should remain isolated in your home for 10 days from symptom onset AND greater than 72 hours after symptoms resolution (absence of fever without the use of fever-reducing medication and improvement in respiratory symptoms), whichever is longer Get plenty of rest and push fluids Tessalon Perles prescribed for cough Use OTC zyrtec for nasal congestion, runny nose, and/or sore throat Use OTC flonase for nasal congestion and runny nose Use medications daily for symptom relief Use OTC medications like ibuprofen or tylenol as needed fever or pain Follow up with PCP in 1-2 days for recheck and to ensure symptoms are improving Go to the ED if you have any new or worsening symptoms such as fever, worsening cough, worsening shortness of breath, chest tightness, chest pain, difficulty breathing, turning blue, changes in mental status, etc..Marland Kitchen

## 2019-03-02 NOTE — ED Triage Notes (Signed)
Pt presents to UC w/ c/o sob since 4 days ago. Pt had diarrhea 2 days ago. Pt c/o headache, sinus congestion x2 days. Pt was positive for covid 2 months ago.

## 2019-03-03 LAB — NOVEL CORONAVIRUS, NAA: SARS-CoV-2, NAA: DETECTED — AB

## 2019-03-04 ENCOUNTER — Telehealth (HOSPITAL_COMMUNITY): Payer: Self-pay | Admitting: Emergency Medicine

## 2019-03-04 NOTE — Telephone Encounter (Signed)
Your test for COVID-19 was positive, meaning that you were infected with the novel coronavirus and could give the germ to others.  Please continue isolation at home for at least 10 days since the start of your symptoms. If you do not have symptoms, please isolate at home for 10 days from the day you were tested. Once you complete your 10 day quarantine, you may return to normal activities as long as you've not had a fever for over 24 hours(without taking fever reducing medicine) and your symptoms are improving. Please continue good preventive care measures, including:  frequent hand-washing, avoid touching your face, cover coughs/sneezes, stay out of crowds and keep a 6 foot distance from others.  Go to the nearest hospital emergency room if fever/cough/breathlessness are severe or illness seems like a threat to life.  Patient contacted by phone and made aware of    results. Pt verbalized understanding and had all questions answered.  Quarantine ends Jan 16th.

## 2019-03-11 ENCOUNTER — Emergency Department (HOSPITAL_COMMUNITY)
Admission: EM | Admit: 2019-03-11 | Discharge: 2019-03-11 | Disposition: A | Discharge status: Home / Self Care | Payer: 59 | Attending: Emergency Medicine | Admitting: Emergency Medicine

## 2019-03-11 ENCOUNTER — Other Ambulatory Visit: Payer: Self-pay

## 2019-03-11 ENCOUNTER — Encounter (HOSPITAL_COMMUNITY): Payer: Self-pay | Admitting: *Deleted

## 2019-03-11 DIAGNOSIS — Z79899 Other long term (current) drug therapy: Secondary | ICD-10-CM | POA: Insufficient documentation

## 2019-03-11 DIAGNOSIS — I10 Essential (primary) hypertension: Secondary | ICD-10-CM | POA: Diagnosis not present

## 2019-03-11 DIAGNOSIS — R519 Headache, unspecified: Secondary | ICD-10-CM | POA: Diagnosis not present

## 2019-03-11 DIAGNOSIS — J45909 Unspecified asthma, uncomplicated: Secondary | ICD-10-CM | POA: Diagnosis not present

## 2019-03-11 DIAGNOSIS — Z8616 Personal history of COVID-19: Secondary | ICD-10-CM | POA: Diagnosis not present

## 2019-03-11 MED ORDER — SODIUM CHLORIDE 0.9 % IV BOLUS
1000.0000 mL | Freq: Once | INTRAVENOUS | Status: AC
  Administered 2019-03-11: 1000 mL via INTRAVENOUS

## 2019-03-11 MED ORDER — DIPHENHYDRAMINE HCL 50 MG/ML IJ SOLN
25.0000 mg | Freq: Once | INTRAMUSCULAR | Status: AC
  Administered 2019-03-11: 25 mg via INTRAVENOUS
  Filled 2019-03-11: qty 1

## 2019-03-11 MED ORDER — PROCHLORPERAZINE EDISYLATE 10 MG/2ML IJ SOLN
10.0000 mg | Freq: Once | INTRAMUSCULAR | Status: AC
  Administered 2019-03-11: 10 mg via INTRAVENOUS
  Filled 2019-03-11: qty 2

## 2019-03-11 MED ORDER — DEXAMETHASONE SODIUM PHOSPHATE 10 MG/ML IJ SOLN
10.0000 mg | Freq: Once | INTRAMUSCULAR | Status: AC
  Administered 2019-03-11: 07:00:00 10 mg via INTRAVENOUS
  Filled 2019-03-11: qty 1

## 2019-03-11 NOTE — ED Triage Notes (Signed)
Pt c/o headache that has gotten worse over the past few days,

## 2019-03-11 NOTE — ED Provider Notes (Signed)
[facility] EMERGENCY DEPARTMENT Provider Note   CSN: [id number] Arrival date & time: 03/11/19  [id number]     History Chief Complaint  Patient presents with  . Headache    Melanie Boyd is a 49 y.o. female.  HPI     This is a 49 year old female with history of asthma and hypertension who presents with headache and body aches.  Patient was diagnosed with COVID-19 on January 10.  She states that she has had a dull headache that has been intermittent for the last 14 days.  It is frontal and temporal in nature.  It is dull.  Currently her pain is 8 out of 10.  She has not had any vision changes.  She states that it will temporarily go away with over-the-counter medication and rest.  She also reports body aches.  No documented fevers at home.  She denies any shortness of breath, chest pain.  She has had some nausea, vomiting and diarrhea.  Denies neck pain or stiffness.  Past Medical History:  Diagnosis Date  . Anemia   . Asthma   . Hypertension   . Shortness of breath     Patient Active Problem List   Diagnosis Date Noted  . Sebaceous cyst   . Abdominal pain 05/30/2012  . Nausea with vomiting 05/30/2012  . Bronchitis, acute 12/05/2010  . Asthma attack 12/03/2010  . Anemia 12/03/2010  . Hyperglycemia 12/03/2010    Past Surgical History:  Procedure Laterality Date  . COLONOSCOPY N/A 07/11/2012   Procedure: COLONOSCOPY;  Surgeon: Rogene Houston, MD;  [facility];  Service: Endoscopy;  Laterality: N/A;  730  . MASS EXCISION Right 06/07/2016   Procedure: EXCISION 1.5 CM MASS RIGHT THIGH;  Surgeon: Aviva Signs, MD;  Location: [facility] ORS;  Service: General;  Laterality: Right;  . TUBAL LIGATION       OB History   No obstetric history on file.     Family History  Problem Relation Age of Onset  . Healthy Mother   . Healthy Father     Social History   Tobacco Use  . Smoking status: Never Smoker  . Smokeless tobacco: Never Used  Substance Use Topics  . Alcohol  use: No  . Drug use: No    Home Medications Prior to Admission medications   Medication Sig Start Date End Date Taking? Authorizing Provider  albuterol (PROVENTIL) (5 MG/ML) 0.5% nebulizer solution Take 2.5 mg by nebulization as needed. 12/05/10 10/11/18  Kathie Dike, MD  benzonatate (TESSALON) 100 MG capsule Take 1 capsule (100 mg total) by mouth every 8 (eight) hours. 03/02/19   Wurst, [place], PA-C  cetirizine (ZYRTEC) 10 MG tablet Take 1 tablet (10 mg total) by mouth daily. 03/02/19   Wurst, [place], PA-C  fluticasone (FLONASE) 50 MCG/ACT nasal spray Place 2 sprays into both nostrils daily. 03/02/19   Wurst, [place], PA-C  furosemide (LASIX) 20 MG tablet Take 1/2 of a pill daily 10/11/18   Milton Ferguson, MD  predniSONE (STERAPRED UNI-PAK 21 TAB) 10 MG (21) TBPK tablet Take by mouth daily. Take 6 tabs by mouth daily  for 2 days, then 5 tabs for 2 days, then 4 tabs for 2 days, then 3 tabs for 2 days, 2 tabs for 2 days, then 1 tab by mouth daily for 2 days 03/02/19   Stacey Boyd, [place], PA-C    Allergies    Gabapentin  Review of Systems   Review of Systems  Constitutional: Negative for fever.  Eyes: Negative  for photophobia and visual disturbance.  Respiratory: Negative for shortness of breath.   Cardiovascular: Negative for chest pain.  Gastrointestinal: Positive for diarrhea, nausea and vomiting. Negative for abdominal pain.  Musculoskeletal: Positive for myalgias.  Neurological: Positive for headaches. Negative for dizziness.  All other systems reviewed and are negative.   Physical Exam Updated Vital Signs BP (!) 149/67   Pulse 84   Temp 98.6 F (37 C) (Oral)   Resp (!) 22   Ht 1.524 m (5')   Wt 108.9 kg   SpO2 98%   BMI 46.87 kg/m   Physical Exam Vitals and nursing note reviewed.  Constitutional:      Appearance: She is well-developed. She is obese.  HENT:     Head: Normocephalic and atraumatic.  Eyes:     Extraocular Movements: Extraocular movements intact.      Pupils: Pupils are equal, round, and reactive to light.  Neck:     Comments: No meningismus Cardiovascular:     Rate and Rhythm: Normal rate and regular rhythm.  Pulmonary:     Effort: Pulmonary effort is normal. No respiratory distress.  Abdominal:     Palpations: Abdomen is soft.     Tenderness: There is no abdominal tenderness.  Musculoskeletal:     Cervical back: Normal range of motion and neck supple.  Skin:    General: Skin is warm and dry.  Neurological:     Mental Status: She is alert and oriented to person, place, and time.     Comments: Fluent speech, cranial nerves II through XII intact, no dysmetria finger, 5 out of 5 strength in all 4 extremities  Psychiatric:        Mood and Affect: Mood normal.     ED Results / Procedures / Treatments   Labs (all labs ordered are listed, but only abnormal results are displayed) Labs Reviewed - No data to display  EKG None  Radiology No results found.  Procedures Procedures (including critical care time)  Medications Ordered in ED Medications  sodium chloride 0.9 % bolus 1,000 mL (1,000 mLs Intravenous New Bag/Given 03/11/19 0716)  prochlorperazine (COMPAZINE) injection 10 mg (10 mg Intravenous Given 03/11/19 0713)  diphenhydrAMINE (BENADRYL) injection 25 mg (25 mg Intravenous Given 03/11/19 0711)  dexamethasone (DECADRON) injection 10 mg (10 mg Intravenous Given 03/11/19 [id number])    ED Course  I have reviewed the triage vital signs and the nursing notes.  Pertinent labs & imaging results that were available during my care of the patient were reviewed by me and considered in my medical decision making (see chart for details).    MDM Rules/Calculators/A&P                       Patient presents with headache.  She also reports myalgias.  Recent Covid diagnosis.  She has no red flags of headache.  Vital signs are reassuring and she is afebrile.  Her physical exam is largely benign.  No signs or symptoms of meningitis.  Low  suspicion for subarachnoid hemorrhage.  Suspect this is likely related to her Covid diagnosis.  Patient was given a migraine cocktail and fluids.  Patient signed out pending reassessment.  Final Clinical Impression(s) / ED Diagnoses Final diagnoses:  None    Rx / DC Orders ED Discharge Orders    None       Skyleigh Windle, Barbette Hair, MD 03/11/19 [phone]

## 2019-03-11 NOTE — ED Provider Notes (Signed)
Patient's headache has resolved with treatment.  Vital signs are reassuring.  Will discharge with return precautions and follow-up with PCP.   Sherwood Gambler, MD 03/11/19 [phone]

## 2019-03-11 NOTE — Discharge Instructions (Addendum)
If you develop continued, recurrent, or worsening headache, fever, neck stiffness, vomiting, blurry or double vision, weakness or numbness in your arms or legs, trouble speaking, or any other new/concerning symptoms then return to the ER for evaluation.  

## 2019-03-12 IMAGING — MG DIGITAL SCREENING BILATERAL MAMMOGRAM WITH TOMO AND CAD
8 of 14 series · 8 of 40 positions shown · non-contrast
Comparison: None.

CLINICAL DATA: Screening.

EXAM:
DIGITAL SCREENING BILATERAL MAMMOGRAM WITH TOMO AND CAD

[L CV synth-2D]
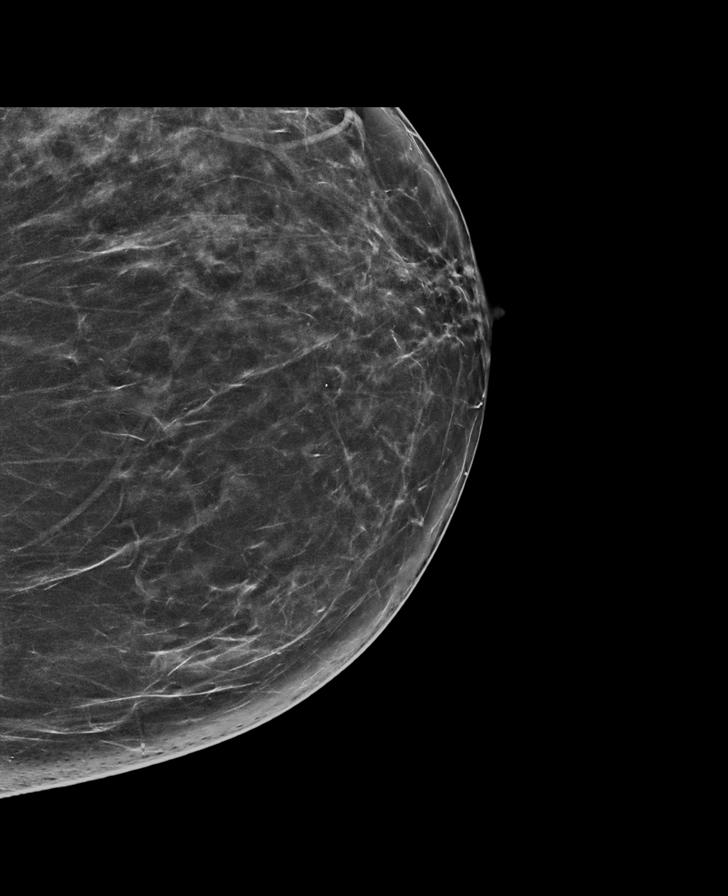

[R MLO synth-2D]
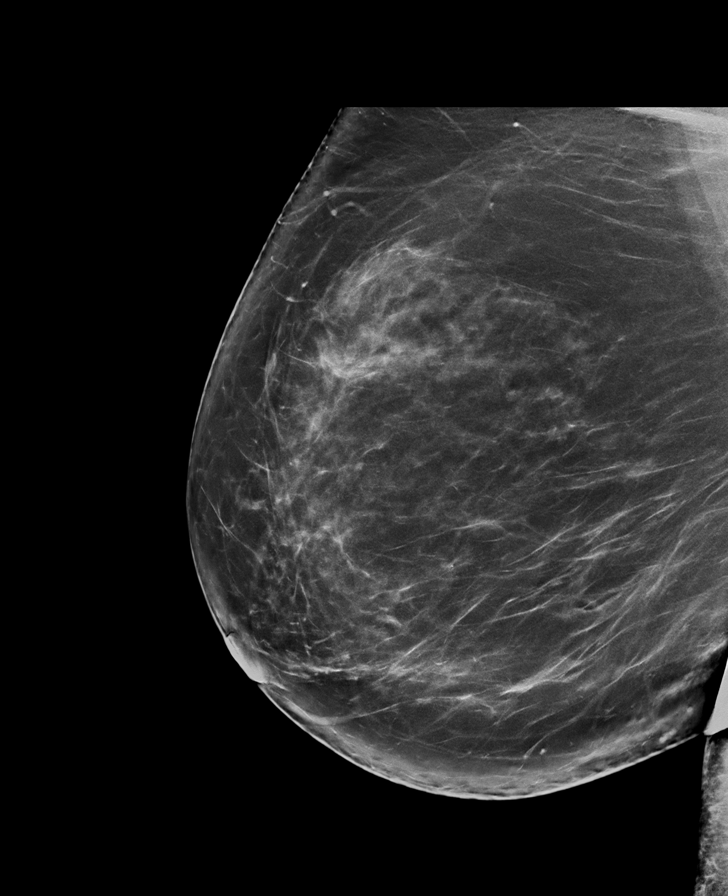

[R CV synth-2D]
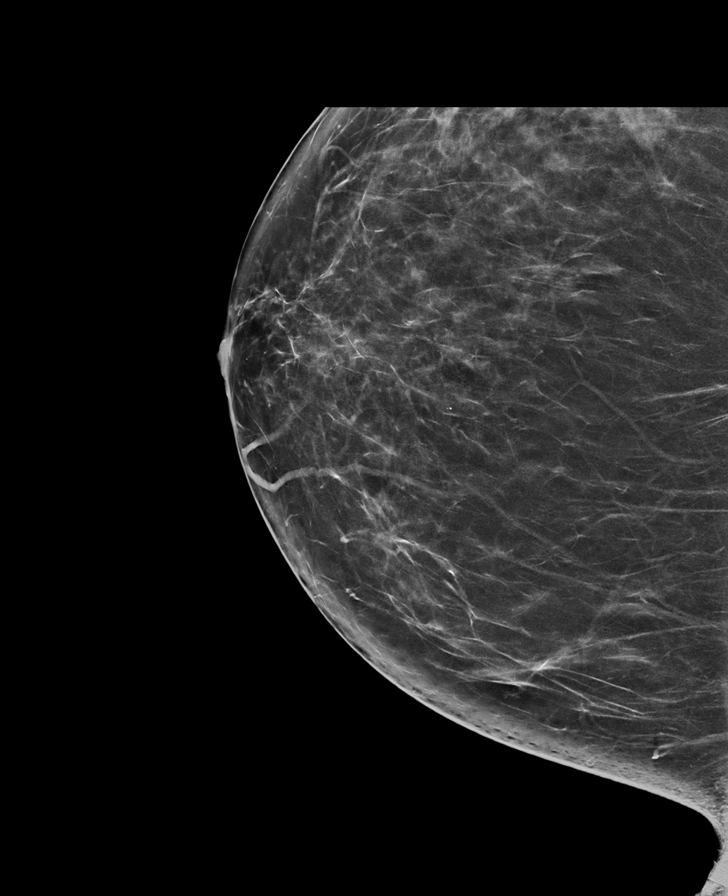

[L CC synth-2D]
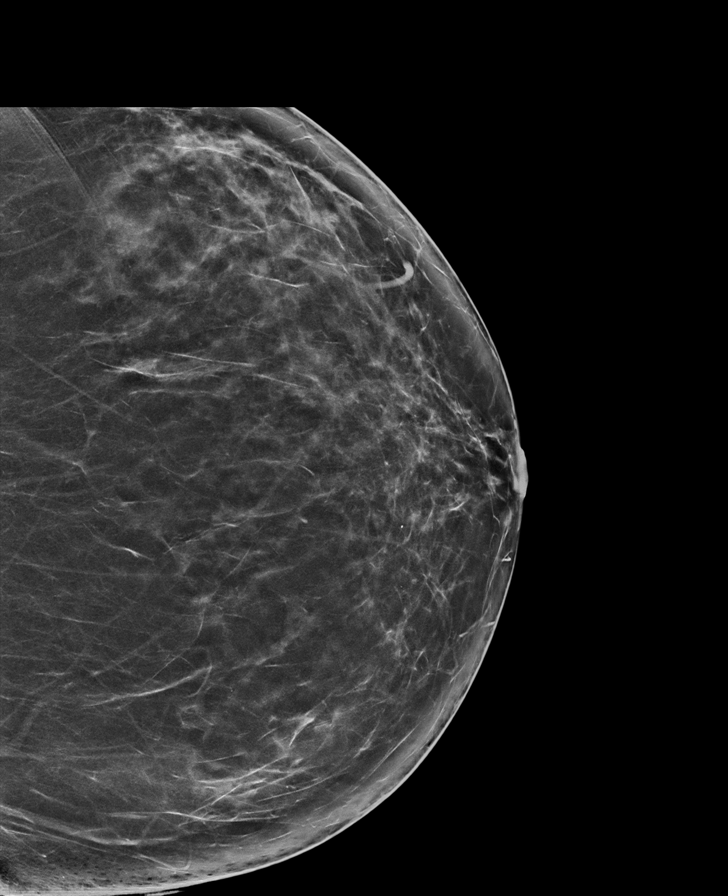

[L MLO synth-2D (1 of 2)]
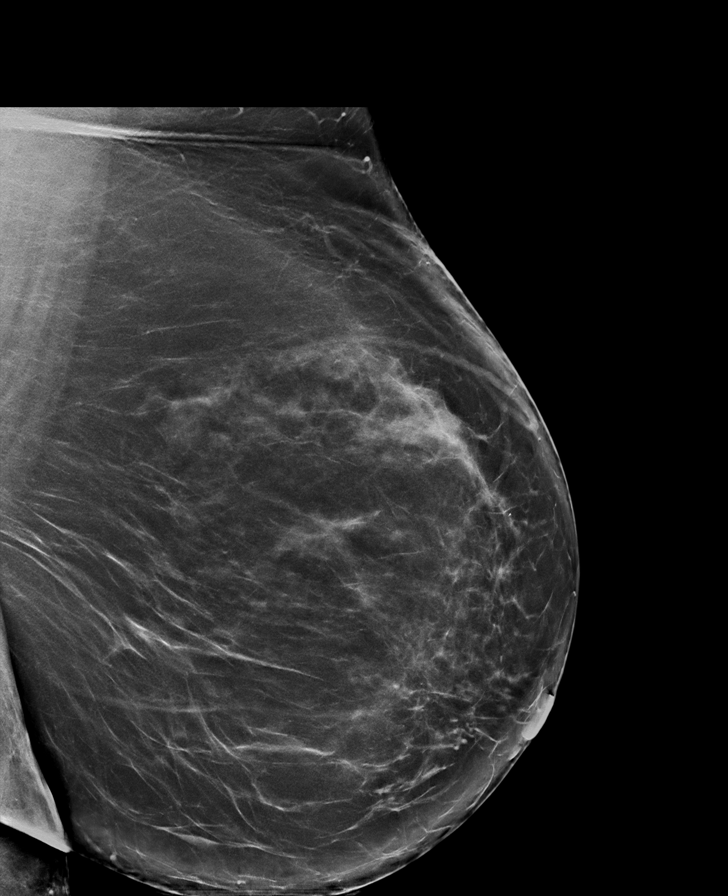

[R CC synth-2D]
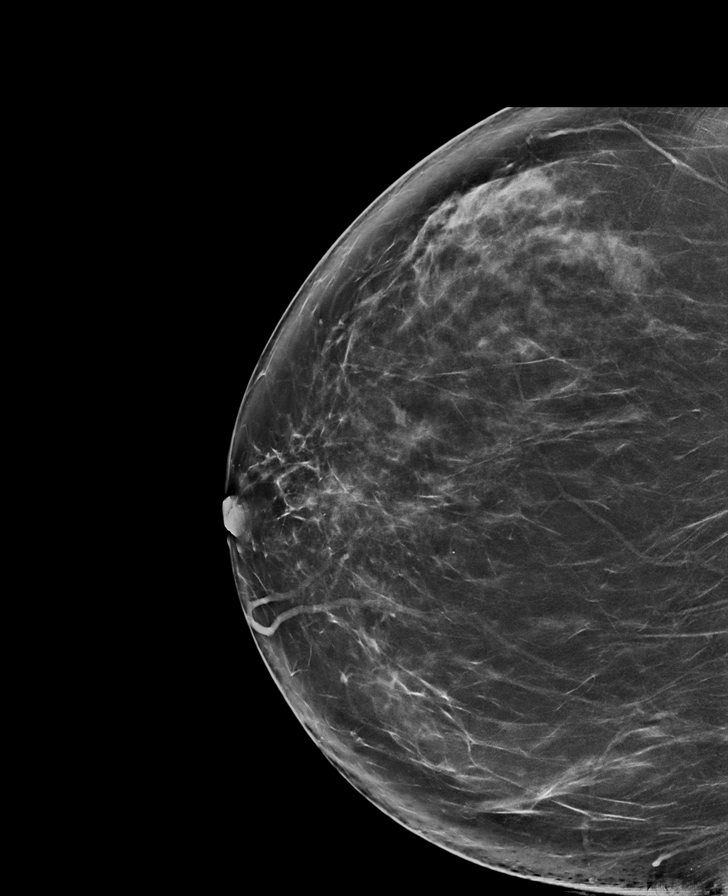

[L MLO synth-2D (2 of 2)]
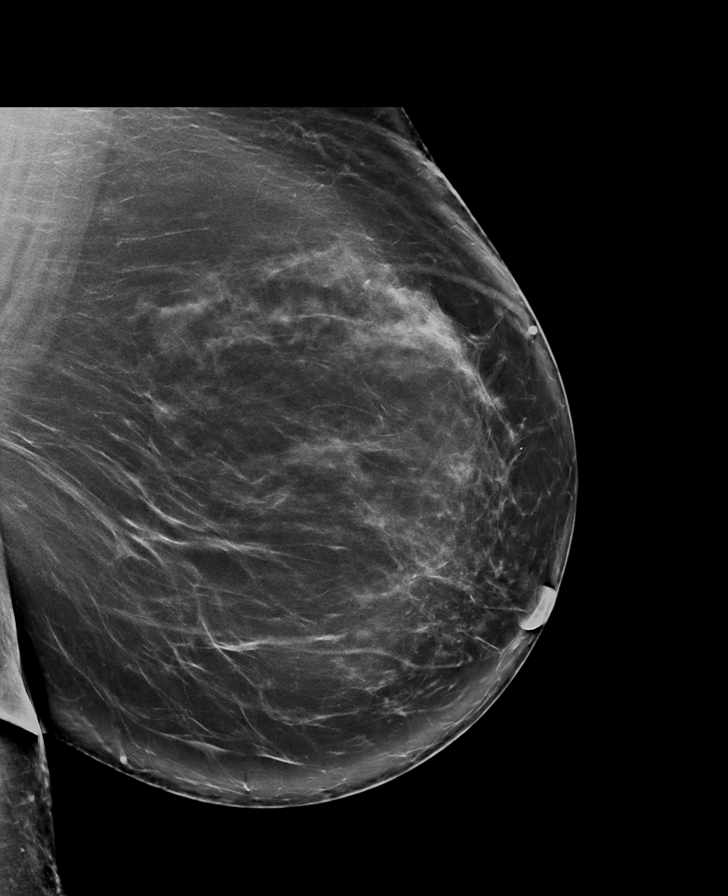

[R CC tomo · tomo slice 45/90.0]
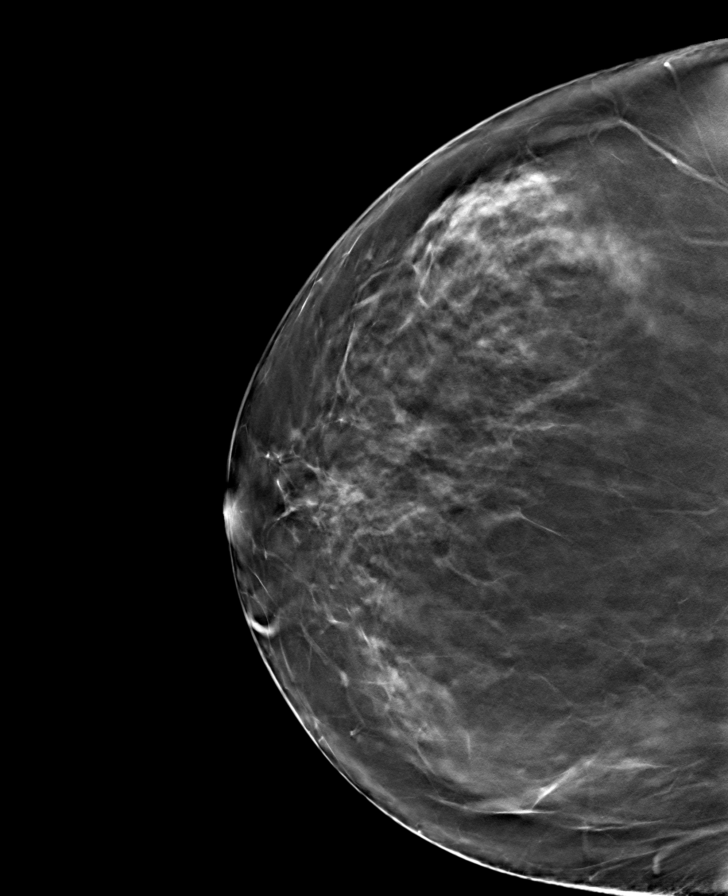

[8 of 40 positions shown; findings below may reference images not displayed]

ACR Breast Density Category b: There are scattered areas of
fibroglandular density.
FINDINGS: There are no findings suspicious for malignancy. Images were
processed with CAD.
IMPRESSION: No mammographic evidence of malignancy. A result letter of this
screening mammogram will be mailed directly to the patient.

RECOMMENDATION:
Screening mammogram in one year. (Code:Y5-G-EJ6)

BI-RADS CATEGORY  1: Negative.

## 2019-03-13 DIAGNOSIS — Z6841 Body Mass Index (BMI) 40.0 and over, adult: Secondary | ICD-10-CM | POA: Diagnosis not present

## 2019-03-13 DIAGNOSIS — U071 COVID-19: Secondary | ICD-10-CM | POA: Diagnosis not present

## 2019-04-03 ENCOUNTER — Ambulatory Visit (INDEPENDENT_AMBULATORY_CARE_PROVIDER_SITE_OTHER): Payer: 59

## 2019-04-03 ENCOUNTER — Other Ambulatory Visit: Payer: Self-pay

## 2019-04-03 ENCOUNTER — Ambulatory Visit
Admission: EM | Admit: 2019-04-03 | Discharge: 2019-04-03 | Disposition: A | Discharge status: Home / Self Care | Payer: 59 | Attending: Emergency Medicine | Admitting: Emergency Medicine

## 2019-04-03 DIAGNOSIS — S8992XA Unspecified injury of left lower leg, initial encounter: Secondary | ICD-10-CM | POA: Diagnosis not present

## 2019-04-03 DIAGNOSIS — G8929 Other chronic pain: Secondary | ICD-10-CM

## 2019-04-03 DIAGNOSIS — M25561 Pain in right knee: Secondary | ICD-10-CM

## 2019-04-03 DIAGNOSIS — S8991XA Unspecified injury of right lower leg, initial encounter: Secondary | ICD-10-CM | POA: Diagnosis not present

## 2019-04-03 DIAGNOSIS — M25562 Pain in left knee: Secondary | ICD-10-CM

## 2019-04-03 NOTE — ED Provider Notes (Signed)
[facility]   [id number] 04/03/19 Arrival Time: [id number]  CC: Bilateral knee pain  SUBJECTIVE: History from: patient. Melanie Boyd is a 49 y.o. female complains of bilateral knee pain (LT > RT) x 1 year, LT knee pain worse over the last few days. States she had a fall a year ago, and has struggled with knee pain since then.  Denies new injury or trauma.  Localizes the left knee pain to front and inside of knee.  Describes the pain as intermittent and sharp in character.  Also reports instability.  Has tried OTC medications with minimal relief.  Symptoms are made worse with weight-bearing, and walking.  Complains of assocaited swelling this morning.  Denies fever, chills, erythema, ecchymosis, numbness and tingling.  ROS: As per HPI.  All other pertinent ROS negative.     Past Medical History:  Diagnosis Date  . Anemia   . Asthma   . Hypertension   . Shortness of breath    Past Surgical History:  Procedure Laterality Date  . COLONOSCOPY N/A 07/11/2012   Procedure: COLONOSCOPY;  Surgeon: Rogene Houston, MD;  [facility]: [facility];  Service: Endoscopy;  Laterality: N/A;  730  . MASS EXCISION Right 06/07/2016   Procedure: EXCISION 1.5 CM MASS RIGHT THIGH;  Surgeon: Aviva Signs, MD;  Location: [facility];  Service: General;  Laterality: Right;  . TUBAL LIGATION     Allergies  Allergen Reactions  . Gabapentin     Vision loss, memory loss   No current facility-administered medications on file prior to encounter.   Current Outpatient Medications on File Prior to Encounter  Medication Sig Dispense Refill  . albuterol (PROVENTIL) (5 MG/ML) 0.5% nebulizer solution Take 2.5 mg by nebulization as needed.    . benzonatate (TESSALON) 100 MG capsule Take 1 capsule (100 mg total) by mouth every 8 (eight) hours. 21 capsule 0  . cetirizine (ZYRTEC) 10 MG tablet Take 1 tablet (10 mg total) by mouth daily. 30 tablet 0  . fluticasone (FLONASE) 50 MCG/ACT nasal spray Place 2 sprays into both  nostrils daily. 16 g 0  . furosemide (LASIX) 20 MG tablet Take 1/2 of a pill daily 30 tablet 0   Social History   Socioeconomic History  . Marital status: Married    Spouse name: Not on file  . Number of children: Not on file  . Years of education: Not on file  . Highest education level: Not on file  Occupational History  . Not on file  Tobacco Use  . Smoking status: Never Smoker  . Smokeless tobacco: Never Used  Substance and Sexual Activity  . Alcohol use: No  . Drug use: No  . Sexual activity: Yes    Birth control/protection: Surgical  Other Topics Concern  . Not on file  Social History Narrative  . Not on file   Social Determinants of Health   Financial Resource Strain:   . Difficulty of Paying Living Expenses: Not on file  Food Insecurity:   . Worried About Charity fundraiser in the Last Year: Not on file  . Ran Out of Food in the Last Year: Not on file  Transportation Needs:   . Lack of Transportation (Medical): Not on file  . Lack of Transportation (Non-Medical): Not on file  Physical Activity:   . Days of Exercise per Week: Not on file  . Minutes of Exercise per Session: Not on file  Stress:   . Feeling of Stress : Not on  file  Social Connections:   . Frequency of Communication with Friends and Family: Not on file  . Frequency of Social Gatherings with Friends and Family: Not on file  . Attends Religious Services: Not on file  . Active Member of Clubs or Organizations: Not on file  . Attends Archivist Meetings: Not on file  . Marital Status: Not on file  Intimate Partner Violence:   . Fear of Current or Ex-Partner: Not on file  . Emotionally Abused: Not on file  . Physically Abused: Not on file  . Sexually Abused: Not on file   Family History  Problem Relation Age of Onset  . Healthy Mother   . Healthy Father     OBJECTIVE:  Vitals:   04/03/19 1152  BP: (!) 139/101  Pulse: 81  Resp: 18  Temp: 98.1 F (36.7 C)  SpO2: 96%     General appearance: ALERT; in no acute distress.  Head: NCAT Lungs: Normal respiratory effort CV: Dorsalis pedis pulse 2+. Cap refill < 2 seconds Musculoskeletal: LT knee  Inspection: Skin warm, dry, clear and intact without obvious erythema, effusion, or ecchymosis.  Palpation: TTP over medial joint line ROM: FROM active and passive Strength: 5/5 knee abduction, 5/5 knee adduction, 5/5 knee flexion, 5/5 knee extension Stability: Anterior/ posterior drawer intact Skin: warm and dry Neurologic: Ambulates with minimal difficulty; Sensation intact about the upper/ lower extremities Psychological: alert and cooperative; normal mood and affect  DIAGNOSTIC STUDIES:  DG Knee Complete 4 Views Left  Result Date: 04/03/2019 CLINICAL DATA:  Fall.  Knee pain. EXAM: LEFT KNEE - COMPLETE 4+ VIEW COMPARISON:  None. FINDINGS: No fracture. No subluxation or dislocation. No joint effusion. Hypertrophic spurring is noted in all 3 compartments. IMPRESSION: Tricompartmental degenerative changes without acute bony abnormality. Electronically Signed   By: Misty Stanley M.D.   On: 04/03/2019 12:17   DG Knee Complete 4 Views Right  Result Date: 04/03/2019 CLINICAL DATA:  Fall.  Knee pain. EXAM: RIGHT KNEE - COMPLETE 4+ VIEW COMPARISON:  None. FINDINGS: No fracture. No subluxation or dislocation. No joint effusion. Hypertrophic spurring is noted in all 3 compartments. IMPRESSION: Tricompartmental degenerative changes without acute bony abnormality. Electronically Signed   By: Misty Stanley M.D.   On: 04/03/2019 12:17    X-rays negative for bony abnormalities including fracture, or dislocation.   I have reviewed the x-rays myself and the radiologist interpretation. I am in agreement with the radiologist interpretation.     ASSESSMENT & PLAN:  1. Chronic pain of both knees   2. Acute pain of left knee   3. Medial joint line tenderness of knee, left    Continue conservative management of rest, ice, and  elevation Knee brace given in office Alternate ibuprofen and tylenole as needed for pain relief  Follow up with orthopedist for further evaluation and management Return or go to the ER if you have any new or worsening symptoms (fever, chills, increased pain, redness, swelling, bruising, etc...)   Reviewed expectations re: course of current medical issues. Questions answered. Outlined signs and symptoms indicating need for more acute intervention. Patient verbalized understanding. After Visit Summary given.    Lestine Box, PA-C 04/03/19 1236

## 2019-04-03 NOTE — Discharge Instructions (Addendum)
Continue conservative management of rest, ice, and elevation Knee brace given in office Alternate ibuprofen and tylenole as needed for pain relief  Follow up with orthopedist for further evaluation and management Return or go to the ER if you have any new or worsening symptoms (fever, chills, increased pain, redness, swelling, bruising, etc...)

## 2019-04-03 NOTE — ED Triage Notes (Signed)
Pt fell on left knee last year and it has given pain since but states today is worse than its ever been, states that hurts worse with weight bearing

## 2019-04-15 DIAGNOSIS — I10 Essential (primary) hypertension: Secondary | ICD-10-CM | POA: Diagnosis not present

## 2019-04-15 DIAGNOSIS — E559 Vitamin D deficiency, unspecified: Secondary | ICD-10-CM | POA: Diagnosis not present

## 2019-04-15 DIAGNOSIS — D649 Anemia, unspecified: Secondary | ICD-10-CM | POA: Diagnosis not present

## 2019-04-15 DIAGNOSIS — Z6841 Body Mass Index (BMI) 40.0 and over, adult: Secondary | ICD-10-CM | POA: Diagnosis not present

## 2019-04-21 MED FILL — OLMESARTAN-HCTZ 40-25 MG TA: 40-25 | 90 days supply | Qty: 90 | Fill #0

## 2019-05-02 ENCOUNTER — Ambulatory Visit
Admission: EM | Admit: 2019-05-02 | Discharge: 2019-05-02 | Disposition: A | Discharge status: Home / Self Care | Payer: 59 | Attending: Emergency Medicine | Admitting: Emergency Medicine

## 2019-05-02 ENCOUNTER — Other Ambulatory Visit: Payer: Self-pay

## 2019-05-02 DIAGNOSIS — J209 Acute bronchitis, unspecified: Secondary | ICD-10-CM | POA: Diagnosis not present

## 2019-05-02 LAB — POCT RAPID STREP A (OFFICE): Rapid Strep A Screen: NEGATIVE

## 2019-05-02 MED ORDER — PREDNISONE 10 MG (21) PO TBPK: ORAL_TABLET | ORAL | 0 refills | Status: DC

## 2019-05-02 MED ORDER — CETIRIZINE HCL 10 MG PO TABS: 10.0000 mg | ORAL_TABLET | Freq: Every day | ORAL | 0 refills | Status: DC

## 2019-05-02 MED ORDER — MENTHOL 3 MG MT LOZG: 1.0000 | LOZENGE | OROMUCOSAL | 1 refills | Status: DC | PRN

## 2019-05-02 MED ORDER — AZITHROMYCIN 250 MG PO TABS: 250.0000 mg | ORAL_TABLET | Freq: Every day | ORAL | 0 refills | Status: DC

## 2019-05-02 NOTE — ED Provider Notes (Signed)
[facility]    CSN: [id number] Arrival date & time: 05/02/19  1246      History   Chief Complaint Chief Complaint  Patient presents with  . Sore Throat  . Fever    HPI Melanie Boyd is a 49 y.o. female.   Who presented to the urgent care for complaint of sore throat, and fever and mild cough for the past 3 days.  Reported she tested negative for COVID-19 48 hours ago.  Denies sick exposure to COVID, flu or strep.  Denies recent travel.  Denies aggravating or alleviating symptoms.  Denies previous COVID infection.   Denies  chills, fatigue, nasal congestion, rhinorrhea, cough, SOB, wheezing, chest pain, nausea, vomiting, changes in bowel or bladder habits.    The history is provided by the patient. No language interpreter was used.  Sore Throat  Fever Associated symptoms: sore throat     Past Medical History:  Diagnosis Date  . Anemia   . Asthma   . Hypertension   . Shortness of breath     Patient Active Problem List   Diagnosis Date Noted  . Sebaceous cyst   . Abdominal pain 05/30/2012  . Nausea with vomiting 05/30/2012  . Bronchitis, acute 12/05/2010  . Asthma attack 12/03/2010  . Anemia 12/03/2010  . Hyperglycemia 12/03/2010    Past Surgical History:  Procedure Laterality Date  . COLONOSCOPY N/A 07/11/2012   Procedure: COLONOSCOPY;  Surgeon: Rogene Houston, MD;  [facility];  Service: Endoscopy;  Laterality: N/A;  730  . MASS EXCISION Right 06/07/2016   Procedure: EXCISION 1.5 CM MASS RIGHT THIGH;  Surgeon: Aviva Signs, MD;  Location: [facility];  Service: General;  Laterality: Right;  . TUBAL LIGATION      OB History   No obstetric history on file.      Home Medications    Prior to Admission medications   Medication Sig Start Date End Date Taking? Authorizing Provider  albuterol (PROVENTIL) (5 MG/ML) 0.5% nebulizer solution Take 2.5 mg by nebulization as needed. 12/05/10 10/11/18  Kathie Dike, MD  azithromycin (ZITHROMAX)  250 MG tablet Take 1 tablet (250 mg total) by mouth daily. Take first 2 tablets together, then 1 every day until finished. 05/02/19   Damonte Frieson, Darrelyn Hillock, FNP  benzonatate (TESSALON) 100 MG capsule Take 1 capsule (100 mg total) by mouth every 8 (eight) hours. 03/02/19   Wurst, [place], PA-C  cetirizine (ZYRTEC ALLERGY) 10 MG tablet Take 1 tablet (10 mg total) by mouth daily. 05/02/19   Jene Oravec, Darrelyn Hillock, FNP  fluticasone (FLONASE) 50 MCG/ACT nasal spray Place 2 sprays into both nostrils daily. 03/02/19   Wurst, [place], PA-C  furosemide (LASIX) 20 MG tablet Take 1/2 of a pill daily 10/11/18   Milton Ferguson, MD  menthol-cetylpyridinium (CEPACOL) 3 MG lozenge Take 1 lozenge (3 mg total) by mouth as needed for sore throat. 05/02/19   Renly Roots, Darrelyn Hillock, FNP  olmesartan-hydrochlorothiazide (BENICAR HCT) 40-25 MG tablet Take 1 tablet by mouth daily. 04/21/19   [provider]  predniSONE (STERAPRED UNI-PAK 21 TAB) 10 MG (21) TBPK tablet Take 6 tabs by mouth daily  for 2 days, then 5 tabs for 2 days, then 4 tabs for 2 days, then 3 tabs for 2 days, 2 tabs for 2 days, then 1 tab by mouth daily for 2 days 05/02/19   Emerson Monte, FNP    Family History Family History  Problem Relation Age of Onset  . Healthy Mother   .  Healthy Father     Social History Social History   Tobacco Use  . Smoking status: Never Smoker  . Smokeless tobacco: Never Used  Substance Use Topics  . Alcohol use: No  . Drug use: No     Allergies   Gabapentin   Review of Systems Review of Systems  Constitutional: Positive for fever.  HENT: Positive for sore throat.   Respiratory: Negative.   Cardiovascular: Negative.   Gastrointestinal: Negative.   Neurological: Negative.      Physical Exam Triage Vital Signs ED Triage Vitals  Enc Vitals Group     BP 05/02/19 1257 (!) 180/80     Pulse Rate 05/02/19 1257 74     Resp 05/02/19 1257 18     Temp 05/02/19 1257 98.3 F (36.8 C)     Temp src --      SpO2  05/02/19 1257 98 %     Weight --      Height --      Head Circumference --      Peak Flow --      Pain Score 05/02/19 1256 7     Pain Loc --      Pain Edu? --      Excl. in [place]? --    No data found.  Updated Vital Signs BP (!) 180/80   Pulse 74   Temp 98.3 F (36.8 C)   Resp 18   LMP 04/03/2019   SpO2 98%   Visual Acuity Right Eye Distance:   Left Eye Distance:   Bilateral Distance:    Right Eye Near:   Left Eye Near:    Bilateral Near:     Physical Exam Vitals and nursing note reviewed.  Constitutional:      General: She is not in acute distress.    Appearance: Normal appearance. She is well-developed and normal weight. She is not ill-appearing or toxic-appearing.  HENT:     Head: Normocephalic.     Right Ear: Tympanic membrane, ear canal and external ear normal. There is no impacted cerumen.     Left Ear: Tympanic membrane, ear canal and external ear normal. There is no impacted cerumen.     Nose: Nose normal. No congestion.     Mouth/Throat:     Mouth: Mucous membranes are moist.     Pharynx: Oropharynx is clear. No oropharyngeal exudate or posterior oropharyngeal erythema.     Tonsils: 2+ on the right. 2+ on the left.  Cardiovascular:     Rate and Rhythm: Normal rate and regular rhythm.     Pulses: Normal pulses.     Heart sounds: Normal heart sounds. No murmur.  Pulmonary:     Effort: Pulmonary effort is normal. No respiratory distress.     Breath sounds: Wheezing present. No rhonchi.  Chest:     Chest wall: No tenderness.  Abdominal:     General: Abdomen is flat. Bowel sounds are normal. There is no distension.     Palpations: There is no mass.  Neurological:     Mental Status: She is alert and oriented to person, place, and time.      UC Treatments / Results  Labs (all labs ordered are listed, but only abnormal results are displayed) Labs Reviewed  CULTURE, GROUP A STREP [facility])  POCT RAPID STREP A (OFFICE)    EKG   Radiology No results  found.  Procedures Procedures (including critical care time)  Medications Ordered in UC Medications - No data  to display  Initial Impression / Assessment and Plan / UC Course  I have reviewed the triage vital signs and the nursing notes.  Pertinent labs & imaging results that were available during my care of the patient were reviewed by me and considered in my medical decision making (see chart for details).     Patient stable for discharge. POCT strep test was negative Prednisone taper was prescribed for wheezing Zyrtec was prescribed Cepacol was prescribed Azithromycin prescribed for possible bronchitis Follow-up with primary care Return for worsening symptoms   Final Clinical Impressions(s) / UC Diagnoses   Final diagnoses:  Acute bronchitis, unspecified organism     Discharge Instructions     Strep test negative, will send out for culture and we will call you with results Declines test for mono at this time Get plenty of rest and push fluids Zyrtec D prescribed. Use daily for symptomatic relief   Drink warm or cool liquids, use throat lozenges, or popsicles to help alleviate symptoms Take OTC ibuprofen or tylenol as needed for pain Follow up with PCP if symptoms persists Return or go to ER if patient has any new or worsening symptoms such as fever, chills, nausea, vomiting, worsening sore throat, cough, abdominal pain, chest pain, changes in bowel or bladder habits, etc... Reviewed expectations re: course of current medical issues. Questions answered. Outlined signs and symptoms indicating need for more acute intervention. Patient verbalized understanding. After Visit Summary given.      ED Prescriptions    Medication Sig Dispense Auth. Provider   predniSONE (STERAPRED UNI-PAK 21 TAB) 10 MG (21) TBPK tablet Take 6 tabs by mouth daily  for 2 days, then 5 tabs for 2 days, then 4 tabs for 2 days, then 3 tabs for 2 days, 2 tabs for 2 days, then 1 tab by mouth daily  for 2 days 42 tablet Shloima Clinch, Darrelyn Hillock, FNP   cetirizine (ZYRTEC ALLERGY) 10 MG tablet Take 1 tablet (10 mg total) by mouth daily. 30 tablet Holman Bonsignore, Darrelyn Hillock, FNP   menthol-cetylpyridinium (CEPACOL) 3 MG lozenge Take 1 lozenge (3 mg total) by mouth as needed for sore throat. 100 tablet Amilliana Hayworth, Darrelyn Hillock, FNP   azithromycin (ZITHROMAX) 250 MG tablet Take 1 tablet (250 mg total) by mouth daily. Take first 2 tablets together, then 1 every day until finished. 6 tablet Dhani Dannemiller, Darrelyn Hillock, FNP     PDMP not reviewed this encounter.   Emerson Monte, FNP 05/02/19 1340

## 2019-05-02 NOTE — Discharge Instructions (Addendum)
Strep test negative, will send out for culture and we will call you with results Declines test for mono at this time Get plenty of rest and push fluids Zyrtec D prescribed. Use daily for symptomatic relief   Drink warm or cool liquids, use throat lozenges, or popsicles to help alleviate symptoms Take OTC ibuprofen or tylenol as needed for pain Follow up with PCP if symptoms persists Return or go to ER if patient has any new or worsening symptoms such as fever, chills, nausea, vomiting, worsening sore throat, cough, abdominal pain, chest pain, changes in bowel or bladder habits, etc... Reviewed expectations re: course of current medical issues. Questions answered. Outlined signs and symptoms indicating need for more acute intervention. Patient verbalized understanding. After Visit Summary given.

## 2019-05-02 NOTE — ED Triage Notes (Signed)
Pt presents with c/o sore throat that began on 3/9 and has had fever 102. Pt took tylenol at 11:00

## 2019-05-05 LAB — CULTURE, GROUP A STREP (THRC)

## 2019-05-19 DIAGNOSIS — H524 Presbyopia: Secondary | ICD-10-CM | POA: Diagnosis not present

## 2019-06-04 DIAGNOSIS — Z0001 Encounter for general adult medical examination with abnormal findings: Secondary | ICD-10-CM | POA: Diagnosis not present

## 2019-06-04 DIAGNOSIS — D509 Iron deficiency anemia, unspecified: Secondary | ICD-10-CM | POA: Diagnosis not present

## 2019-06-04 DIAGNOSIS — Z23 Encounter for immunization: Secondary | ICD-10-CM | POA: Diagnosis not present

## 2019-06-04 DIAGNOSIS — Z6841 Body Mass Index (BMI) 40.0 and over, adult: Secondary | ICD-10-CM | POA: Diagnosis not present

## 2019-06-04 DIAGNOSIS — Z124 Encounter for screening for malignant neoplasm of cervix: Secondary | ICD-10-CM | POA: Diagnosis not present

## 2019-06-04 DIAGNOSIS — I10 Essential (primary) hypertension: Secondary | ICD-10-CM | POA: Diagnosis not present

## 2019-06-04 DIAGNOSIS — R7309 Other abnormal glucose: Secondary | ICD-10-CM | POA: Diagnosis not present

## 2019-06-04 DIAGNOSIS — Z13220 Encounter for screening for lipoid disorders: Secondary | ICD-10-CM | POA: Diagnosis not present

## 2019-06-04 DIAGNOSIS — R000 Tachycardia, unspecified: Secondary | ICD-10-CM | POA: Diagnosis not present

## 2019-10-01 ENCOUNTER — Ambulatory Visit (INDEPENDENT_AMBULATORY_CARE_PROVIDER_SITE_OTHER): Payer: 59 | Admitting: Orthopedic Surgery

## 2019-10-01 ENCOUNTER — Other Ambulatory Visit: Payer: Self-pay

## 2019-10-01 ENCOUNTER — Encounter: Payer: Self-pay | Admitting: Orthopedic Surgery

## 2019-10-01 VITALS — HR 68 | Ht 61.0 in | Wt 246.0 lb

## 2019-10-01 DIAGNOSIS — M25562 Pain in left knee: Secondary | ICD-10-CM

## 2019-10-01 DIAGNOSIS — G8929 Other chronic pain: Secondary | ICD-10-CM

## 2019-10-01 DIAGNOSIS — Z6841 Body Mass Index (BMI) 40.0 and over, adult: Secondary | ICD-10-CM

## 2019-10-01 MED ORDER — MELOXICAM 7.5 MG PO TABS: 7.5000 mg | ORAL_TABLET | Freq: Every day | ORAL | 5 refills | Status: DC

## 2019-10-01 NOTE — Patient Instructions (Signed)
Weight loss   Exercise  Mri   meloxicam

## 2019-10-01 NOTE — Progress Notes (Signed)
NEW PROBLEM//OFFICE VISIT  Chief Complaint  Patient presents with  . 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  She reports falling on her left knee a year ago had a large bruise over the medial side of the left knee and that seemed to get better after 7 to 10 days.  After 5 months though she started having pain again associated with locking and giving way  She went to urgent care she had x-rays they placed her in a brace she has been on ibuprofen intermittently seems to take it before she works but no other times.  She does not like taking medication  She says it is hard for her to go up and down the steps   Review of Systems  Neurological: Negative for tingling.  All other systems reviewed and are negative.    Past Medical History:  Diagnosis Date  . Anemia   . Asthma   . Hypertension   . Shortness of breath     Past Surgical History:  Procedure Laterality Date  . COLONOSCOPY N/A 07/11/2012   Procedure: COLONOSCOPY;  Surgeon: Rogene Houston, MD;  [facility];  Service: Endoscopy;  Laterality: N/A;  730  . MASS EXCISION Right 06/07/2016   Procedure: EXCISION 1.5 CM MASS RIGHT THIGH;  Surgeon: Aviva Signs, MD;  Location: [facility];  Service: General;  Laterality: Right;  . TUBAL LIGATION      Family History  Problem Relation Age of Onset  . Healthy Mother   . Healthy Father    Social History   Tobacco Use  . Smoking status: Never Smoker  . Smokeless tobacco: Never Used  Vaping Use  . Vaping Use: Never used  Substance Use Topics  . Alcohol use: No  . Drug use: No    Allergies  Allergen Reactions  . Gabapentin     Vision loss, memory loss    Current Meds  Medication Sig  . furosemide (LASIX) 20 MG tablet Take 1/2 of a pill daily  . ibuprofen (ADVIL) 600 MG tablet Take 600 mg by mouth every 6 (six) hours as needed.    Pulse  68   Ht 5\' 1"  (1.549 m)   Wt 246 lb (111.6 kg)   BMI 46.48 kg/m  The patient meets the AMA guidelines for Morbid (severe) obesity with a BMI > 40.0 and I have recommended weight loss.  Physical Exam Constitutional:      General: She is not in acute distress.    Appearance: She is well-developed.  Cardiovascular:     Comments: No peripheral edema Skin:    General: Skin is warm and dry.  Neurological:     Mental Status: She is alert and oriented to person, place, and time.     Sensory: No sensory deficit.     Coordination: Coordination normal.     Gait: Gait normal.     Deep Tendon Reflexes: Reflexes are normal and symmetric.     Ortho Exam  Right knee no tenderness crepitus in the patella normal range of motion no instability normal muscle tone skin intact  Left knee tenderness in the peripatellar region no effusion normal range of motion no instability muscle tone intact skin normal  MEDICAL DECISION MAKING  A.  Encounter Diagnosis  Name Primary?  . Chronic pain of left knee  Yes    B. DATA ANALYSED:    IMAGING: Independent interpretation of images: 2 sets of x-rays 4 views of each knee patient has mild to moderate degenerative changes minimal joint space narrowing in both knees  Orders: MRI  Outside records reviewed: None  C. MANAGEMENT   Weight loss Exercise Oral NSAIDs meloxicam MRI left knee  Meds ordered this encounter  Medications  . meloxicam (MOBIC) 7.5 MG tablet    Sig: Take 1 tablet (7.5 mg total) by mouth daily.    Dispense:  30 tablet    Refill:  5     Meds ordered this encounter  Medications  . meloxicam (MOBIC) 7.5 MG tablet    Sig: Take 1 tablet (7.5 mg total) by mouth daily.    Dispense:  30 tablet    Refill:  5      Arther Abbott, MD  10/01/2019 11:46 AM

## 2019-10-11 ENCOUNTER — Ambulatory Visit (HOSPITAL_COMMUNITY)
Admission: RE | Admit: 2019-10-11 | Discharge: 2019-10-11 | Disposition: A | Discharge status: Home / Self Care | Payer: 59 | Source: Ambulatory Visit | Attending: Orthopedic Surgery | Admitting: Orthopedic Surgery

## 2019-10-11 ENCOUNTER — Other Ambulatory Visit: Payer: Self-pay

## 2019-10-11 DIAGNOSIS — M25562 Pain in left knee: Secondary | ICD-10-CM | POA: Diagnosis not present

## 2019-10-11 DIAGNOSIS — G8929 Other chronic pain: Secondary | ICD-10-CM | POA: Diagnosis not present

## 2019-10-16 ENCOUNTER — Telehealth: Payer: Self-pay | Admitting: Orthopedic Surgery

## 2019-10-16 NOTE — Telephone Encounter (Signed)
Called patient per clinic staff's note for a 4 week appointment; left message.

## 2019-10-20 NOTE — Telephone Encounter (Signed)
Called back to patient; reached, reagarding scheduling.

## 2019-11-17 ENCOUNTER — Ambulatory Visit (INDEPENDENT_AMBULATORY_CARE_PROVIDER_SITE_OTHER): Payer: 59 | Admitting: Orthopedic Surgery

## 2019-11-17 ENCOUNTER — Encounter: Payer: Self-pay | Admitting: Orthopedic Surgery

## 2019-11-17 ENCOUNTER — Other Ambulatory Visit: Payer: Self-pay

## 2019-11-17 VITALS — BP 187/88 | HR 79 | Ht 61.0 in | Wt 245.0 lb

## 2019-11-17 DIAGNOSIS — M1712 Unilateral primary osteoarthritis, left knee: Secondary | ICD-10-CM

## 2019-11-17 DIAGNOSIS — M1710 Unilateral primary osteoarthritis, unspecified knee: Secondary | ICD-10-CM

## 2019-11-17 DIAGNOSIS — M25562 Pain in left knee: Secondary | ICD-10-CM

## 2019-11-17 DIAGNOSIS — G8929 Other chronic pain: Secondary | ICD-10-CM

## 2019-11-17 DIAGNOSIS — Z6841 Body Mass Index (BMI) 40.0 and over, adult: Secondary | ICD-10-CM | POA: Diagnosis not present

## 2019-11-17 NOTE — Patient Instructions (Signed)

## 2019-11-17 NOTE — Progress Notes (Addendum)
Chief Complaint  Patient presents with   Knee Pain    left   Results    review MRI left knee    Encounter Diagnoses  Name Primary?   Body mass index 45.0-49.9, adult (HCC)    Morbid obesity (HCC)    Chronic pain of left knee Yes   Primary localized osteoarthritis of knee     Assessment and plan 49 year old female had MRI of the knee showed arthritis we will continue to treat arthritis  She will continue with weight loss moderate exercise she will take ibuprofen at work continue meloxicam at home  Injection left knee  Meloxicam caused some nausea  Body mass index is 46.29 kg/m. The patient meets the AMA guidelines for Morbid (severe) obesity with a BMI > 40.0 and I have recommended weight loss.  History 49 year old female fell landed on left knee had worsening pain which did not improve after anti-inflammatories.  She had an MRI the MRI shows osteoarthritis of the knee without meniscal tear  Left knee examination shows that the knee is quiet today although it is tender on the medial lateral joint line there is no effusion she has good range of motion normal strength and no instability in the knee  Personal interpretation of MRI.  MRI images were reviewed.  No frank meniscal tear but 3 compartment arthrosis is seen    Procedure note left knee injection   verbal consent was obtained to inject left knee joint  Timeout was completed to confirm the site of injection  The medications used were 40 mg of Depo-Medrol and 1% lidocaine 3 cc  Anesthesia was provided by ethyl chloride and the skin was prepped with alcohol.  After cleaning the skin with alcohol a 20-gauge needle was used to inject the left knee joint. There were no complications. A sterile bandage was applied.   Chronic problem, injection, complication from prescription,

## 2020-01-05 ENCOUNTER — Other Ambulatory Visit: Payer: Self-pay

## 2020-01-05 ENCOUNTER — Ambulatory Visit
Admission: EM | Admit: 2020-01-05 | Discharge: 2020-01-05 | Disposition: A | Discharge status: Home / Self Care | Payer: 59 | Attending: Emergency Medicine | Admitting: Emergency Medicine

## 2020-01-05 DIAGNOSIS — R059 Cough, unspecified: Secondary | ICD-10-CM | POA: Diagnosis not present

## 2020-01-05 DIAGNOSIS — H65191 Other acute nonsuppurative otitis media, right ear: Secondary | ICD-10-CM | POA: Diagnosis not present

## 2020-01-05 DIAGNOSIS — J029 Acute pharyngitis, unspecified: Secondary | ICD-10-CM

## 2020-01-05 DIAGNOSIS — Z1152 Encounter for screening for COVID-19: Secondary | ICD-10-CM | POA: Diagnosis not present

## 2020-01-05 LAB — POCT RAPID STREP A (OFFICE): Rapid Strep A Screen: NEGATIVE

## 2020-01-05 MED ORDER — FLUTICASONE PROPIONATE 50 MCG/ACT NA SUSP: 1.0000 | Freq: Every day | NASAL | 0 refills | Status: DC

## 2020-01-05 MED ORDER — CETIRIZINE HCL 10 MG PO TABS: 10.0000 mg | ORAL_TABLET | Freq: Every day | ORAL | 0 refills | Status: AC

## 2020-01-05 MED ORDER — BENZONATATE 100 MG PO CAPS: 100.0000 mg | ORAL_CAPSULE | Freq: Three times a day (TID) | ORAL | 0 refills | Status: DC

## 2020-01-05 MED ORDER — NEOMYCIN-POLYMYXIN-HC 3.5-10000-1 OT SUSP: 4.0000 [drp] | Freq: Three times a day (TID) | OTIC | 0 refills | Status: DC

## 2020-01-05 NOTE — ED Triage Notes (Signed)
Recently had covid

## 2020-01-05 NOTE — ED Provider Notes (Signed)
[place]   [id number] 01/05/20 Arrival Time: 1509  CC:EAR PAIN  SUBJECTIVE: History from: patient.  Melanie Boyd is a 49 y.o. female who presented to the urgent care for complaint of right ear pain, cough and sore throat for the past 2-3 days.  Denies a precipitating event, such as swimming or wearing ear plugs.  Patient states the pain is constant and achy in character.  Patient has tried OTC medication without relief.  Symptoms are made worse with lying down.  Denies similar symptoms in the past.   Denies fever, chills, fatigue, sinus pain, rhinorrhea, ear discharge, SOB, wheezing, chest pain, nausea, changes in bowel or bladder habits.    ROS: As per HPI.  All other pertinent ROS negative.     Past Medical History:  Diagnosis Date  . Anemia   . Asthma   . Hypertension   . Shortness of breath    Past Surgical History:  Procedure Laterality Date  . COLONOSCOPY N/A 07/11/2012   Procedure: COLONOSCOPY;  Surgeon: Rogene Houston, MD;  Location: [facility];  Service: Endoscopy;  Laterality: N/A;  730  . MASS EXCISION Right 06/07/2016   Procedure: EXCISION 1.5 CM MASS RIGHT THIGH;  Surgeon: Aviva Signs, MD;  Location: [facility];  Service: General;  Laterality: Right;  . TUBAL LIGATION     Allergies  Allergen Reactions  . Gabapentin     Vision loss, memory loss   No current facility-administered medications on file prior to encounter.   Current Outpatient Medications on File Prior to Encounter  Medication Sig Dispense Refill  . furosemide (LASIX) 20 MG tablet Take 1/2 of a pill daily 30 tablet 0  . ibuprofen (ADVIL) 600 MG tablet Take 600 mg by mouth every 6 (six) hours as needed.    . meloxicam (MOBIC) 7.5 MG tablet Take 7.5 mg by mouth daily.     Social History   Socioeconomic History  . Marital status: Married    Spouse name: Not on file  . Number of children: Not on file  . Years of education: Not on file  . Highest education level: Not on file    Occupational History  . Not on file  Tobacco Use  . Smoking status: Never Smoker  . Smokeless tobacco: Never Used  Vaping Use  . Vaping Use: Never used  Substance and Sexual Activity  . Alcohol use: No  . Drug use: No  . Sexual activity: Yes    Birth control/protection: Surgical  Other Topics Concern  . Not on file  Social History Narrative  . Not on file   Social Determinants of Health   Financial Resource Strain:   . Difficulty of Paying Living Expenses: Not on file  Food Insecurity:   . Worried About Charity fundraiser in the Last Year: Not on file  . Ran Out of Food in the Last Year: Not on file  Transportation Needs:   . Lack of Transportation (Medical): Not on file  . Lack of Transportation (Non-Medical): Not on file  Physical Activity:   . Days of Exercise per Week: Not on file  . Minutes of Exercise per Session: Not on file  Stress:   . Feeling of Stress : Not on file  Social Connections:   . Frequency of Communication with Friends and Family: Not on file  . Frequency of Social Gatherings with Friends and Family: Not on file  . Attends Religious Services: Not on file  . Active Member of  Clubs or Organizations: Not on file  . Attends Archivist Meetings: Not on file  . Marital Status: Not on file  Intimate Partner Violence:   . Fear of Current or Ex-Partner: Not on file  . Emotionally Abused: Not on file  . Physically Abused: Not on file  . Sexually Abused: Not on file   Family History  Problem Relation Age of Onset  . Healthy Mother   . Healthy Father     OBJECTIVE:  Vitals:   01/05/20 1524  BP: (!) 150/88  Pulse: 80  Resp: 20  Temp: 98 F (36.7 C)  SpO2: 96%     General appearance: alert; appears fatigued HEENT: Ears: EACs clear, TMs with right middle ear effusion; Eyes: PERRL, EOMI grossly; Sinuses nontender to palpation; Nose: clear rhinorrhea; Throat: oropharynx mildly erythematous, tonsils 1+ without white tonsillar exudates,  uvula midline Neck: supple without LAD Lungs: unlabored respirations, symmetrical air entry; cough: moderate; no respiratory distress Heart: regular rate and rhythm.  Radial pulses 2+ symmetrical bilaterally Skin: warm and dry Psychological: alert and cooperative; normal mood and affect  Imaging: No results found.   ASSESSMENT & PLAN:  1. Sore throat   2. Cough   3. Acute MEE (middle ear effusion), right   4. Encounter for screening for COVID-19     Meds ordered this encounter  Medications  . benzonatate (TESSALON) 100 MG capsule    Sig: Take 1 capsule (100 mg total) by mouth every 8 (eight) hours.    Dispense:  30 capsule    Refill:  0  . fluticasone (FLONASE) 50 MCG/ACT nasal spray    Sig: Place 1 spray into both nostrils daily for 14 days.    Dispense:  16 g    Refill:  0  . cetirizine (ZYRTEC ALLERGY) 10 MG tablet    Sig: Take 1 tablet (10 mg total) by mouth daily.    Dispense:  30 tablet    Refill:  0   Discharge instructions   COVID-19 test was completed and will take 2 to 7 days for results to return someone will call if your result is abnormal Strep test was negative.  Sample will be sent for culture and someone will call if your result is abnormal. Rest and drink plenty of fluids Tessalon Perles prescribed for cough Zyrtec prescribed for congestion Flonase prescribed for congestion and middle ear effusion Use OTC lozenges to help soothe your throat Take medications as directed and to completion Continue to use OTC ibuprofen and/ or tylenol as needed for pain control Follow up with PCP if symptoms persists Return here or go to the ER if you have any new or worsening symptoms   Reviewed expectations re: course of current medical issues. Questions answered. Outlined signs and symptoms indicating need for more acute intervention. Patient verbalized understanding. After Visit Summary given.         Emerson Monte, [place] 01/05/20 1558

## 2020-01-05 NOTE — ED Triage Notes (Signed)
Pt presents with right ear pain for past few days, also has cough

## 2020-01-05 NOTE — Discharge Instructions (Signed)
COVID-19 test was completed and will take 2 to 7 days for results to return someone will call if your result is abnormal Strep test was negative.  Sample will be sent for culture and someone will call if your result is abnormal. Rest and drink plenty of fluids Tessalon Perles prescribed for cough Zyrtec prescribed for congestion Flonase prescribed for congestion and middle ear effusion Use OTC lozenges to help soothe your throat Take medications as directed and to completion Continue to use OTC ibuprofen and/ or tylenol as needed for pain control Follow up with PCP if symptoms persists Return here or go to the ER if you have any new or worsening symptoms

## 2020-01-06 LAB — SARS-COV-2, NAA 2 DAY TAT

## 2020-01-06 LAB — NOVEL CORONAVIRUS, NAA: SARS-CoV-2, NAA: NOT DETECTED

## 2020-01-08 LAB — CULTURE, GROUP A STREP (THRC)

## 2020-01-19 DIAGNOSIS — J069 Acute upper respiratory infection, unspecified: Secondary | ICD-10-CM | POA: Diagnosis not present

## 2020-03-15 ENCOUNTER — Encounter: Payer: Self-pay | Admitting: Orthopedic Surgery

## 2020-03-15 ENCOUNTER — Ambulatory Visit (INDEPENDENT_AMBULATORY_CARE_PROVIDER_SITE_OTHER): Payer: 59 | Admitting: Orthopedic Surgery

## 2020-03-15 ENCOUNTER — Ambulatory Visit: Payer: 59 | Admitting: Orthopedic Surgery

## 2020-03-15 ENCOUNTER — Other Ambulatory Visit: Payer: Self-pay

## 2020-03-15 VITALS — Ht 61.0 in

## 2020-03-15 DIAGNOSIS — S63591A Other specified sprain of right wrist, initial encounter: Secondary | ICD-10-CM | POA: Diagnosis not present

## 2020-03-15 DIAGNOSIS — G8929 Other chronic pain: Secondary | ICD-10-CM

## 2020-03-15 DIAGNOSIS — Z6841 Body Mass Index (BMI) 40.0 and over, adult: Secondary | ICD-10-CM

## 2020-03-15 DIAGNOSIS — M1712 Unilateral primary osteoarthritis, left knee: Secondary | ICD-10-CM | POA: Diagnosis not present

## 2020-03-15 DIAGNOSIS — M25562 Pain in left knee: Secondary | ICD-10-CM

## 2020-03-15 DIAGNOSIS — M1710 Unilateral primary osteoarthritis, unspecified knee: Secondary | ICD-10-CM

## 2020-03-15 MED ORDER — PREDNISONE 10 MG PO TABS: 10.0000 mg | ORAL_TABLET | Freq: Two times a day (BID) | ORAL | 0 refills | Status: AC

## 2020-03-15 MED ORDER — PREDNISONE 10 MG PO TABS: 10.0000 mg | ORAL_TABLET | Freq: Two times a day (BID) | ORAL | 0 refills | Status: DC

## 2020-03-15 NOTE — Patient Instructions (Signed)
Wear wrist brace   Take this medication for 7 days

## 2020-03-15 NOTE — Addendum Note (Signed)
Addended by: Brand Males E on: 03/15/2020 02:09 PM   Modules accepted: Orders

## 2020-03-15 NOTE — Progress Notes (Signed)
Chief Complaint  Patient presents with  . Knee Pain    Left knee, patient reports its been worse since the first snow.     Assessment and plan  Encounter Diagnoses  Name Primary?  . Body mass index 45.0-49.9, adult ([place]) Yes  . Morbid obesity ([place])   . Chronic pain of left knee   . Primary localized osteoarthritis of knee   . Complex tear of triangular fibrocartilage of right wrist, initial encounter     50 year old female with osteoarthritis left knee has had MRI already does not need arthroscopic surgery perhaps knee replacement in the future and when she loses weight  Patient has chronic pain in her left knee which did well with injection last time.  She is working on weight loss.  Pain is increased over the last 4 weeks.  Increased pain left knee requested injection which was given  New onset ulnar-sided wrist pain seems to be a TFCC type problem  Recommend 7 days of prednisone and wrist wrap If that presents all the problems she is welcome to come back for further evaluation including imaging    This visit   req inj left knee   ROS: my right wrist hurts   Ulnar sided wrist pain worse with picking up trays since job duties changed   Injection performed left knee joint  Procedure note left knee injection   verbal consent was obtained to inject left knee joint  Timeout was completed to confirm the site of injection  The medications used were 40 mg of Depo-Medrol and 1% lidocaine 3 cc  Anesthesia was provided by ethyl chloride and the skin was prepped with alcohol.  After cleaning the skin with alcohol a 20-gauge needle was used to inject the left knee joint. There were no complications. A sterile bandage was applied.  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  She says she changed jobs now lifting more trays started having ulnar-sided wrist pain a few weeks back tried some oral medication did not  help  He denies numbness or tingling or weakness  Exam of the right wrist she is tender over the ulnar side of the wrist and the TFCC region she has increased pain with making a fist she has no pain with range of motion range of motion is not limited she is neurovascularly intact   LAST VISIT:   Assessment and plan 50 year old female had MRI of the knee showed arthritis we will continue to treat arthritis   She will continue with weight loss moderate exercise she will take ibuprofen at work continue meloxicam at home   Injection left knee   Meloxicam caused some nausea   Body mass index is 46.29 kg/m. The patient meets the AMA guidelines for Morbid (severe) obesity with a BMI > 40.0 and I have recommended weight loss.   History 50 year old female fell landed on left knee had worsening pain which did not improve after anti-inflammatories.  She had an MRI the MRI shows osteoarthritis of the knee without meniscal tear    Personal interpretation of MRI.  MRI images were reviewed.  No frank meniscal tear but 3 compartment arthrosis is seen

## 2020-06-14 ENCOUNTER — Ambulatory Visit (INDEPENDENT_AMBULATORY_CARE_PROVIDER_SITE_OTHER): Payer: 59 | Admitting: Orthopedic Surgery

## 2020-06-14 ENCOUNTER — Telehealth: Payer: Self-pay | Admitting: Orthopedic Surgery

## 2020-06-14 ENCOUNTER — Encounter: Payer: Self-pay | Admitting: Orthopedic Surgery

## 2020-06-14 ENCOUNTER — Other Ambulatory Visit: Payer: Self-pay

## 2020-06-14 VITALS — Ht 61.0 in | Wt 248.0 lb

## 2020-06-14 DIAGNOSIS — Z6841 Body Mass Index (BMI) 40.0 and over, adult: Secondary | ICD-10-CM | POA: Diagnosis not present

## 2020-06-14 DIAGNOSIS — M1710 Unilateral primary osteoarthritis, unspecified knee: Secondary | ICD-10-CM

## 2020-06-14 DIAGNOSIS — M25562 Pain in left knee: Secondary | ICD-10-CM

## 2020-06-14 DIAGNOSIS — G8929 Other chronic pain: Secondary | ICD-10-CM

## 2020-06-14 DIAGNOSIS — M1712 Unilateral primary osteoarthritis, left knee: Secondary | ICD-10-CM | POA: Diagnosis not present

## 2020-06-14 MED ORDER — PREDNISONE 10 MG (48) PO TBPK: ORAL_TABLET | Freq: Every day | ORAL | 0 refills | Status: DC

## 2020-06-14 NOTE — Progress Notes (Signed)
Chief Complaint  Patient presents with  . Knee Pain    Left knee pain.     Encounter Diagnoses  Name Primary?  . Body mass index 40.0-44.9, adult ([place])   . Morbid obesity ([place])   . Chronic pain of left knee Yes  . Primary localized osteoarthritis of knee    This is a 50 year old female with high BMI who has osteoarthritis of left knee and is status post 2 steroid injections a trial of Mobic which made her sick presents with continued left knee pain interfering with normal activities and work  Examination reveals an obese female awake alert and oriented x3 mood and affect normal  She is limping favoring her left knee  Although she has a fairly good range of motion  She has a lot of tenderness over the medial joint line.  I think she is a good candidate for hyaluronic acid injection in the meantime.  Put her on a prednisone Dosepak as she got good relief from that in the past  Encounter Diagnoses  Name Primary?  . Body mass index 40.0-44.9, adult ([place])   . Morbid obesity ([place])   . Chronic pain of left knee Yes  . Primary localized osteoarthritis of knee      Meds ordered this encounter  Medications  . predniSONE (STERAPRED UNI-PAK 48 TAB) 10 MG (48) TBPK tablet    Sig: Take by mouth daily. 12 days DS as directed    Dispense:  48 tablet    Refill:  0   Call px when HA approved

## 2020-06-14 NOTE — Telephone Encounter (Signed)
Dr. Aline Brochure wants to see if prior authorization is needed for HA injections. This patient has the choice plan through [facility]. Please advise.

## 2020-06-14 NOTE — Telephone Encounter (Signed)
Submitted online Euf[employer] for benefits.  Left knee.

## 2020-06-17 NOTE — Telephone Encounter (Signed)
Per BV patient would likely incur all costs as deductible not met yet.  Patient would like to do the direct pay program Trivisc.  Submitted online for this.

## 2020-06-24 ENCOUNTER — Telehealth: Payer: Self-pay | Admitting: Orthopedic Surgery

## 2020-06-24 NOTE — Telephone Encounter (Signed)
Call via voice message received from [place], ph# [phone], to verify shipping address and need-by date, to proceed with order. Please advise, and also when we may schedule patient's appointments for injection(s).

## 2020-06-25 NOTE — Telephone Encounter (Signed)
Per Wendy's response, called back to [facility] [phone]; spoke with Sharyn Lull; states will let Melissa know, ready to ship; allow 2 to 3 business days; will be coming via [employer]; notified patient of status, and will update patient when medication is received. States she heard from them also, and has paid her copay.

## 2020-07-06 NOTE — Telephone Encounter (Signed)
We have called and left a message for patient, per Amy, to schedule for the 3 injections of Trivisc, which have been approved.

## 2020-07-08 ENCOUNTER — Ambulatory Visit (INDEPENDENT_AMBULATORY_CARE_PROVIDER_SITE_OTHER): Payer: 59 | Admitting: Orthopedic Surgery

## 2020-07-08 ENCOUNTER — Other Ambulatory Visit: Payer: Self-pay

## 2020-07-08 ENCOUNTER — Encounter: Payer: Self-pay | Admitting: Orthopedic Surgery

## 2020-07-08 DIAGNOSIS — M25562 Pain in left knee: Secondary | ICD-10-CM

## 2020-07-08 DIAGNOSIS — M1711 Unilateral primary osteoarthritis, right knee: Secondary | ICD-10-CM

## 2020-07-08 DIAGNOSIS — G8929 Other chronic pain: Secondary | ICD-10-CM

## 2020-07-08 NOTE — Patient Instructions (Signed)

## 2020-07-08 NOTE — Progress Notes (Addendum)
Chief Complaint  Patient presents with  . Knee Pain    Right knee  / injection number one trivisc lot number [id number]    Procedure note for injection of hyaluronic acid   Diagnosis osteoarthritis of the knee  Verbal consent was obtained to inject the knee with HYALURONIC ACID . Timeout was completed to confirm the injection site as the right   Knee  Ethyl chloride spray was used for anesthesia Alcohol was used to prep the skin. The infrapatellar lateral portal was used as an injection site and 1 vial of hyaluronic acid  was injected into the right knee  Specific Co. Preparation: trivisc  No complications were noted  Encounter Diagnosis  Name Primary?  . Chronic pain of left knee Yes   See in 1 wk for 2nd inj

## 2020-07-09 NOTE — Telephone Encounter (Signed)
Patient scheduled.

## 2020-07-15 ENCOUNTER — Encounter: Payer: Self-pay | Admitting: Orthopedic Surgery

## 2020-07-15 ENCOUNTER — Other Ambulatory Visit: Payer: Self-pay

## 2020-07-15 ENCOUNTER — Ambulatory Visit (INDEPENDENT_AMBULATORY_CARE_PROVIDER_SITE_OTHER): Payer: 59 | Admitting: Orthopedic Surgery

## 2020-07-15 DIAGNOSIS — M1711 Unilateral primary osteoarthritis, right knee: Secondary | ICD-10-CM

## 2020-07-15 DIAGNOSIS — G8929 Other chronic pain: Secondary | ICD-10-CM

## 2020-07-15 DIAGNOSIS — M1710 Unilateral primary osteoarthritis, unspecified knee: Secondary | ICD-10-CM

## 2020-07-15 NOTE — Progress Notes (Signed)
Chief Complaint  Patient presents with  . Injections    2nd Trivisc right knee    Encounter Diagnoses  Name Primary?  . Primary localized osteoarthritis of knee Yes  . Chronic pain of right knee     Melanie Boyd is here for the second TRIVISC injection right knee.  Right knee is quiet  Site confirmed right knee medication confirmed  Medication injected lateral approach  Ethyl chloride and alcohol used to prepare the skin  Procedure note for injection of hyaluronic acid   Diagnosis osteoarthritis of the knee  Verbal consent was obtained to inject the knee with HYALURONIC ACID . Timeout was completed to confirm the injection site as the [facility]   Knee  Ethyl chloride spray was used for anesthesia Alcohol was used to prep the skin. The infrapatellar lateral portal was used as an injection site and 1 vial of hyaluronic acid  was injected into the knee  Specific Co. Preparation: TRIVISC  No complications were noted  F/U 1 WEEK FOR 3RD TRIVISC INJ RT KNEE

## 2020-07-22 ENCOUNTER — Other Ambulatory Visit: Payer: Self-pay

## 2020-07-22 ENCOUNTER — Ambulatory Visit: Payer: 59 | Admitting: Orthopedic Surgery

## 2020-07-22 DIAGNOSIS — G8929 Other chronic pain: Secondary | ICD-10-CM

## 2020-07-22 DIAGNOSIS — M1711 Unilateral primary osteoarthritis, right knee: Secondary | ICD-10-CM | POA: Diagnosis not present

## 2020-07-22 DIAGNOSIS — M1710 Unilateral primary osteoarthritis, unspecified knee: Secondary | ICD-10-CM

## 2020-07-22 NOTE — Progress Notes (Signed)
Follow-up for third trivisc  Injection  Chief Complaint  Patient presents with  . Knee Pain    R/ doing so much better. Here for last injection.    Timeout right knee injection  Procedure note for injection of hyaluronic acid   Diagnosis osteoarthritis of the knee  Verbal consent was obtained to inject the knee with HYALURONIC ACID . Timeout was completed to confirm the injection site as the right   Knee  Ethyl chloride spray was used for anesthesia Alcohol was used to prep the skin. The infrapatellar lateral portal was used as an injection site and 1 vial of hyaluronic acid  was injected into the knee  Specific Co. Preparation: trivisc   No complications were noted  Return 6 weeks

## 2020-09-02 ENCOUNTER — Ambulatory Visit (INDEPENDENT_AMBULATORY_CARE_PROVIDER_SITE_OTHER): Payer: 59 | Admitting: Orthopedic Surgery

## 2020-09-02 ENCOUNTER — Encounter: Payer: Self-pay | Admitting: Orthopedic Surgery

## 2020-09-02 ENCOUNTER — Other Ambulatory Visit: Payer: Self-pay

## 2020-09-02 VITALS — BP 187/82 | HR 73 | Ht 61.0 in | Wt 250.0 lb

## 2020-09-02 DIAGNOSIS — Z6841 Body Mass Index (BMI) 40.0 and over, adult: Secondary | ICD-10-CM

## 2020-09-02 DIAGNOSIS — M25561 Pain in right knee: Secondary | ICD-10-CM | POA: Diagnosis not present

## 2020-09-02 DIAGNOSIS — G8929 Other chronic pain: Secondary | ICD-10-CM | POA: Diagnosis not present

## 2020-09-02 DIAGNOSIS — M25562 Pain in left knee: Secondary | ICD-10-CM

## 2020-09-02 NOTE — Progress Notes (Signed)
FOLLOW UP   Encounter Diagnoses  Name Primary?   Chronic pain of right knee Yes   Chronic pain of left knee    Body mass index 40.0-44.9, adult ([place])    Morbid obesity [facility])      Chief Complaint  Patient presents with   Knee Pain    Right knee better after the injections      The patient has had 3 injections right knee with hyaluronic acid although the pain has not completely gone is significantly better  Medication ibuprofen as needed  She does have some discomfort in her left knee especially after walking for long periods of time but it is manageable  She is noted to have 120 degrees of flexion in the right and left knee both have good free and easy range of motion no swelling  Patient is advised to work on weight loss take her ibuprofen as needed and take 1 before she goes to work and then 1 after if needed  Follow-up as needed

## 2020-11-01 DIAGNOSIS — B349 Viral infection, unspecified: Secondary | ICD-10-CM | POA: Diagnosis not present

## 2020-11-02 DIAGNOSIS — Z13220 Encounter for screening for lipoid disorders: Secondary | ICD-10-CM | POA: Diagnosis not present

## 2020-11-02 DIAGNOSIS — R5383 Other fatigue: Secondary | ICD-10-CM | POA: Diagnosis not present

## 2020-11-02 DIAGNOSIS — I10 Essential (primary) hypertension: Secondary | ICD-10-CM | POA: Diagnosis not present

## 2020-11-02 DIAGNOSIS — Z6841 Body Mass Index (BMI) 40.0 and over, adult: Secondary | ICD-10-CM | POA: Diagnosis not present

## 2020-11-02 DIAGNOSIS — J4520 Mild intermittent asthma, uncomplicated: Secondary | ICD-10-CM | POA: Diagnosis not present

## 2020-11-02 DIAGNOSIS — Z1331 Encounter for screening for depression: Secondary | ICD-10-CM | POA: Diagnosis not present

## 2020-11-02 DIAGNOSIS — D509 Iron deficiency anemia, unspecified: Secondary | ICD-10-CM | POA: Diagnosis not present

## 2020-11-02 DIAGNOSIS — U071 COVID-19: Secondary | ICD-10-CM | POA: Diagnosis not present

## 2020-11-02 DIAGNOSIS — Z1389 Encounter for screening for other disorder: Secondary | ICD-10-CM | POA: Diagnosis not present

## 2020-11-02 DIAGNOSIS — Z0001 Encounter for general adult medical examination with abnormal findings: Secondary | ICD-10-CM | POA: Diagnosis not present

## 2020-11-02 DIAGNOSIS — R7309 Other abnormal glucose: Secondary | ICD-10-CM | POA: Diagnosis not present

## 2021-04-20 DIAGNOSIS — J069 Acute upper respiratory infection, unspecified: Secondary | ICD-10-CM | POA: Diagnosis not present

## 2021-05-11 ENCOUNTER — Other Ambulatory Visit (HOSPITAL_COMMUNITY): Payer: Self-pay | Admitting: Family Medicine

## 2021-05-11 DIAGNOSIS — Z1231 Encounter for screening mammogram for malignant neoplasm of breast: Secondary | ICD-10-CM

## 2021-07-14 ENCOUNTER — Ambulatory Visit (INDEPENDENT_AMBULATORY_CARE_PROVIDER_SITE_OTHER): Payer: 59 | Admitting: Orthopedic Surgery

## 2021-07-14 DIAGNOSIS — M1710 Unilateral primary osteoarthritis, unspecified knee: Secondary | ICD-10-CM

## 2021-07-14 DIAGNOSIS — M25561 Pain in right knee: Secondary | ICD-10-CM | POA: Diagnosis not present

## 2021-07-14 DIAGNOSIS — G8929 Other chronic pain: Secondary | ICD-10-CM | POA: Diagnosis not present

## 2021-07-14 DIAGNOSIS — M25562 Pain in left knee: Secondary | ICD-10-CM | POA: Diagnosis not present

## 2021-07-14 DIAGNOSIS — Z6841 Body Mass Index (BMI) 40.0 and over, adult: Secondary | ICD-10-CM

## 2021-07-14 MED ORDER — MELOXICAM 7.5 MG PO TABS: 7.5000 mg | ORAL_TABLET | Freq: Every day | ORAL | 5 refills | Status: DC

## 2021-07-14 NOTE — Progress Notes (Signed)
Chief Complaint  Patient presents with   Knee Pain    Left is worse than right    Encounter Diagnoses  Name Primary?   Body mass index 40.0-44.9, adult ([facility]) Yes   Chronic pain of right knee    Chronic pain of left knee    Primary localized osteoarthritis of knee    Morbid obesity ([place])    Body mass index 45.0-49.9, adult ([place])     Melanie Boyd is still having bilateral knee pain is seems to be exacerbated by the amount of work she does with increased pain associated with increased work  She would like her medication refilled we discussed weight loss as she is still not lost any weight have any significant degree  I asked her to talk to her medical doctor about that  She has requested bilateral knee injections and a refill on her meloxicam   Procedure note for bilateral knee injections  Procedure note left knee injection verbal consent was obtained to inject left knee joint  Timeout was completed to confirm the site of injection  The medications used were 40 mg depomedrol and 3 cc of 1% lidocaine  Anesthesia was provided by ethyl chloride and the skin was prepped with alcohol.  After cleaning the skin with alcohol a 20-gauge needle was used to inject the left knee joint. There were no complications. A sterile bandage was applied.   Procedure note right knee injection verbal consent was obtained to inject right knee joint  Timeout was completed to confirm the site of injection  The medications used were 40 mg depomedrol and 3 cc of 1% lidocaine  Anesthesia was provided by ethyl chloride and the skin was prepped with alcohol.  After cleaning the skin with alcohol a 20-gauge needle was used to inject the right knee joint. There were no complications. A sterile bandage was applied.   REFILL MELOXICAM   Meds ordered this encounter  Medications   meloxicam (MOBIC) 7.5 MG tablet    Sig: Take 1 tablet (7.5 mg total) by mouth daily.    Dispense:  30 tablet    Refill:  5   FU  6 MONTHS   WORK ON WEIGHT LOSS

## 2021-07-20 DIAGNOSIS — J329 Chronic sinusitis, unspecified: Secondary | ICD-10-CM | POA: Diagnosis not present

## 2021-07-20 DIAGNOSIS — Z6841 Body Mass Index (BMI) 40.0 and over, adult: Secondary | ICD-10-CM | POA: Diagnosis not present

## 2021-08-24 DIAGNOSIS — J329 Chronic sinusitis, unspecified: Secondary | ICD-10-CM | POA: Diagnosis not present

## 2021-08-24 DIAGNOSIS — Z6841 Body Mass Index (BMI) 40.0 and over, adult: Secondary | ICD-10-CM | POA: Diagnosis not present

## 2021-08-24 DIAGNOSIS — H6690 Otitis media, unspecified, unspecified ear: Secondary | ICD-10-CM | POA: Diagnosis not present

## 2021-08-24 DIAGNOSIS — H6990 Unspecified Eustachian tube disorder, unspecified ear: Secondary | ICD-10-CM | POA: Diagnosis not present

## 2021-11-29 DIAGNOSIS — Z1331 Encounter for screening for depression: Secondary | ICD-10-CM | POA: Diagnosis not present

## 2021-11-29 DIAGNOSIS — R5383 Other fatigue: Secondary | ICD-10-CM | POA: Diagnosis not present

## 2021-11-29 DIAGNOSIS — D509 Iron deficiency anemia, unspecified: Secondary | ICD-10-CM | POA: Diagnosis not present

## 2021-11-29 DIAGNOSIS — Z0000 Encounter for general adult medical examination without abnormal findings: Secondary | ICD-10-CM | POA: Diagnosis not present

## 2021-11-29 DIAGNOSIS — Z6841 Body Mass Index (BMI) 40.0 and over, adult: Secondary | ICD-10-CM | POA: Diagnosis not present

## 2021-11-29 DIAGNOSIS — J4520 Mild intermittent asthma, uncomplicated: Secondary | ICD-10-CM | POA: Diagnosis not present

## 2021-11-29 DIAGNOSIS — I10 Essential (primary) hypertension: Secondary | ICD-10-CM | POA: Diagnosis not present

## 2021-11-29 DIAGNOSIS — R7309 Other abnormal glucose: Secondary | ICD-10-CM | POA: Diagnosis not present

## 2021-11-29 DIAGNOSIS — E119 Type 2 diabetes mellitus without complications: Secondary | ICD-10-CM | POA: Diagnosis not present

## 2021-11-30 ENCOUNTER — Other Ambulatory Visit (HOSPITAL_COMMUNITY): Payer: Self-pay | Admitting: Family Medicine

## 2021-11-30 DIAGNOSIS — Z1231 Encounter for screening mammogram for malignant neoplasm of breast: Secondary | ICD-10-CM

## 2022-01-02 ENCOUNTER — Other Ambulatory Visit (HOSPITAL_COMMUNITY): Payer: Self-pay

## 2022-01-03 ENCOUNTER — Other Ambulatory Visit (HOSPITAL_COMMUNITY): Payer: Self-pay

## 2022-01-03 MED ORDER — MELOXICAM 7.5 MG PO TABS
7.5000 mg | ORAL_TABLET | Freq: Every day | ORAL | 5 refills | Status: DC
  Filled 2022-01-03: qty 30, 30d supply, fill #0

## 2022-01-03 MED ORDER — ALBUTEROL SULFATE (2.5 MG/3ML) 0.083% IN NEBU
3.0000 mL | INHALATION_SOLUTION | Freq: Four times a day (QID) | RESPIRATORY_TRACT | 1 refills | Status: DC | PRN
  Filled 2022-01-03: qty 180, 15d supply, fill #0

## 2022-01-03 MED ORDER — ALBUTEROL SULFATE 4 MG PO TABS
4.0000 mg | ORAL_TABLET | Freq: Three times a day (TID) | ORAL | 0 refills | Status: DC | PRN
  Filled 2022-01-03: qty 60, 20d supply, fill #0

## 2022-01-03 MED ORDER — SEMAGLUTIDE-WEIGHT MANAGEMENT 0.25 MG/0.5ML ~~LOC~~ SOAJ
0.2500 mg | SUBCUTANEOUS | 2 refills | Status: DC
  Filled 2022-01-03 – 2022-03-08 (×2): qty 2, 28d supply, fill #0

## 2022-01-03 MED ORDER — ALBUTEROL SULFATE HFA 108 (90 BASE) MCG/ACT IN AERS
2.0000 | INHALATION_SPRAY | Freq: Four times a day (QID) | RESPIRATORY_TRACT | 1 refills | Status: DC | PRN
  Filled 2022-01-03: qty 6.7, 25d supply, fill #0

## 2022-01-11 ENCOUNTER — Encounter: Payer: 59 | Admitting: Orthopedic Surgery

## 2022-01-11 ENCOUNTER — Encounter: Payer: Self-pay | Admitting: Orthopaedic Surgery

## 2022-01-11 ENCOUNTER — Ambulatory Visit (INDEPENDENT_AMBULATORY_CARE_PROVIDER_SITE_OTHER): Payer: 59 | Admitting: Orthopaedic Surgery

## 2022-01-11 ENCOUNTER — Encounter: Payer: Self-pay | Admitting: Orthopedic Surgery

## 2022-01-11 ENCOUNTER — Other Ambulatory Visit (HOSPITAL_COMMUNITY): Payer: Self-pay

## 2022-01-11 DIAGNOSIS — G8929 Other chronic pain: Secondary | ICD-10-CM

## 2022-01-11 DIAGNOSIS — M25561 Pain in right knee: Secondary | ICD-10-CM

## 2022-01-11 DIAGNOSIS — M25562 Pain in left knee: Secondary | ICD-10-CM

## 2022-01-11 MED ORDER — METHYLPREDNISOLONE ACETATE 40 MG/ML IJ SUSP
40.0000 mg | Freq: Once | INTRAMUSCULAR | Status: AC
  Administered 2022-01-11: 40 mg via INTRA_ARTICULAR

## 2022-01-11 NOTE — Progress Notes (Signed)
PROCEDURE NOTE:  The patient requests injections of the left knee , verbal consent was obtained.  The left knee was prepped appropriately after time out was performed.   Sterile technique was observed and injection of 1 cc of DepoMedrol 40 mg with several cc's of plain xylocaine. Anesthesia was provided by ethyl chloride and a 20-gauge needle was used to inject the knee area. The injection was tolerated well.  A band aid dressing was applied.  The patient was advised to apply ice later today and tomorrow to the injection sight as needed.  PROCEDURE NOTE:  The patient requests injections of the right knee , verbal consent was obtained.  The right knee was prepped appropriately after time out was performed.   Sterile technique was observed and injection of 1 cc of DepoMedrol '40mg'$  with several cc's of plain xylocaine. Anesthesia was provided by ethyl chloride and a 20-gauge needle was used to inject the knee area. The injection was tolerated well.  A band aid dressing was applied.  The patient was advised to apply ice later today and tomorrow to the injection sight as needed.  Encounter Diagnoses  Name Primary?   Chronic pain of right knee Yes   Chronic pain of left knee    Return prn.  Call if any problem.  Precautions discussed.  Electronically Signed Sanjuana Kava, MD 11/22/20239:18 AM

## 2022-01-11 NOTE — Addendum Note (Signed)
Addended by: Obie Dredge A on: 01/11/2022 10:07 AM   Modules accepted: Orders

## 2022-01-16 NOTE — Progress Notes (Signed)
No show

## 2022-01-17 ENCOUNTER — Other Ambulatory Visit (HOSPITAL_COMMUNITY): Payer: Self-pay

## 2022-01-24 ENCOUNTER — Ambulatory Visit: Payer: 59 | Admitting: Family Medicine

## 2022-01-27 NOTE — Progress Notes (Signed)
This encounter was created in error - please disregard.

## 2022-02-02 ENCOUNTER — Ambulatory Visit (HOSPITAL_COMMUNITY)
Admission: RE | Admit: 2022-02-02 | Discharge: 2022-02-02 | Disposition: A | Discharge status: Home / Self Care | Payer: 59 | Source: Ambulatory Visit | Attending: Family Medicine | Admitting: Family Medicine

## 2022-02-02 DIAGNOSIS — Z1231 Encounter for screening mammogram for malignant neoplasm of breast: Secondary | ICD-10-CM | POA: Insufficient documentation

## 2022-02-02 LAB — HM MAMMOGRAPHY

## 2022-03-03 ENCOUNTER — Ambulatory Visit (INDEPENDENT_AMBULATORY_CARE_PROVIDER_SITE_OTHER): Payer: 59 | Admitting: Family Medicine

## 2022-03-03 ENCOUNTER — Encounter: Payer: Self-pay | Admitting: Family Medicine

## 2022-03-03 VITALS — BP 150/72 | HR 83 | Ht 59.0 in | Wt 270.0 lb

## 2022-03-03 DIAGNOSIS — E559 Vitamin D deficiency, unspecified: Secondary | ICD-10-CM | POA: Diagnosis not present

## 2022-03-03 DIAGNOSIS — Z1159 Encounter for screening for other viral diseases: Secondary | ICD-10-CM

## 2022-03-03 DIAGNOSIS — E038 Other specified hypothyroidism: Secondary | ICD-10-CM | POA: Diagnosis not present

## 2022-03-03 DIAGNOSIS — M25561 Pain in right knee: Secondary | ICD-10-CM

## 2022-03-03 DIAGNOSIS — R7301 Impaired fasting glucose: Secondary | ICD-10-CM | POA: Diagnosis not present

## 2022-03-03 DIAGNOSIS — E7849 Other hyperlipidemia: Secondary | ICD-10-CM

## 2022-03-03 DIAGNOSIS — J4521 Mild intermittent asthma with (acute) exacerbation: Secondary | ICD-10-CM

## 2022-03-03 DIAGNOSIS — Z114 Encounter for screening for human immunodeficiency virus [HIV]: Secondary | ICD-10-CM

## 2022-03-03 DIAGNOSIS — M25562 Pain in left knee: Secondary | ICD-10-CM

## 2022-03-03 DIAGNOSIS — R0602 Shortness of breath: Secondary | ICD-10-CM | POA: Diagnosis not present

## 2022-03-03 MED ORDER — ALBUTEROL SULFATE HFA 108 (90 BASE) MCG/ACT IN AERS: 2.0000 | INHALATION_SPRAY | Freq: Four times a day (QID) | RESPIRATORY_TRACT | 0 refills | Status: DC | PRN

## 2022-03-03 MED ORDER — METHYLPREDNISOLONE 4 MG PO TBPK: ORAL_TABLET | ORAL | 0 refills | Status: DC

## 2022-03-03 NOTE — Assessment & Plan Note (Signed)
She complains of fatigue, congestion, chest tightness, cough, shortness of breath, and earache She reports increased use of albuterol inhaler and requests a refill today We will treat with steroid taper A refill of her albuterol inhaler sent to the pharmacy

## 2022-03-03 NOTE — Assessment & Plan Note (Signed)
She reports increased weight gain and complains of knee pain She reports minimal physical activities but notes adherence to heart healthy diet She was started on Wegovy by her previous provider, but noted that the medication is currently out of stock Review my weight management plan with the patient Inform patient to increase her physical activities and to inform me when wegvoy is instock

## 2022-03-03 NOTE — Patient Instructions (Addendum)
I appreciate the opportunity to provide care to you today!    Follow up:  3 months  Labs: please stop by the lab today or during the week to get your blood drawn (CBC, CMP, TSH, Lipid profile, HgA1c, Vit D)  Screening: HIV and Hep C   Please  pick up your medications the pharmacy Continue to take meloxicam 7.5 mg as needed for arthritis pain in your knees Please inform me when wegovy is in stock at your pharmacy and a prescription will be sent  I recommend increasing your physical activities 5 days a week for 30 minutes     Please continue to a heart-healthy diet and increase your physical activities. Try to exercise for 40mns at least five times a week.      It was a pleasure to see you and I look forward to continuing to work together on your health and well-being. Please do not hesitate to call the office if you need care or have questions about your care.   Have a wonderful day and week. With Gratitude, GAlvira MondayMSN, FNP-BC

## 2022-03-03 NOTE — Assessment & Plan Note (Signed)
History of arthritis in the knees She takes meloxicam 7.5 mg daily She has a BMI of 54.53 Encouraged to maintain a healthy weight and to continue taking meloxicam 7.5 mg daily as needed

## 2022-03-03 NOTE — Progress Notes (Signed)
New Patient Office Visit  Subjective:  Patient ID: Melanie Boyd, female    DOB: November 03, 1970  Age: 52 y.o. MRN: [id number]  CC:  Chief Complaint  Patient presents with   Establish Care    New patient, establishing care, reports feeling fatigued and slight sore throat some mornings, has been using vitamins and otc medication. Would like to discuss weight loss says she eats healthy and has not been able to lose any weight, has knee pain from this.     HPI Melanie Boyd is a 52 y.o. female with past medical history of abdominal pain, hyperglycemia, nausea and vomiting presents for establishing care. For the details of today's visit, please refer to the assessment and plan.     Past Medical History:  Diagnosis Date   Anemia    Asthma    Hypertension    Shortness of breath     Past Surgical History:  Procedure Laterality Date   COLONOSCOPY N/A 07/11/2012   Procedure: COLONOSCOPY;  Surgeon: Rogene Houston, MD;  Location: [facility];  Service: Endoscopy;  Laterality: N/A;  730   MASS EXCISION Right 06/07/2016   Procedure: EXCISION 1.5 CM MASS RIGHT THIGH;  Surgeon: Aviva Signs, MD;  Location: [facility];  Service: General;  Laterality: Right;   TUBAL LIGATION      Family History  Problem Relation Age of Onset   Healthy Mother    Healthy Father     Social History   Socioeconomic History   Marital status: Married    Spouse name: Not on file   Number of children: Not on file   Years of education: Not on file   Highest education level: Not on file  Occupational History   Not on file  Tobacco Use   Smoking status: Never   Smokeless tobacco: Never  Vaping Use   Vaping Use: Never used  Substance and Sexual Activity   Alcohol use: No   Drug use: No   Sexual activity: Yes    Birth control/protection: Surgical  Other Topics Concern   Not on file  Social History Narrative   Not on file   Social Determinants of Health   Financial Resource Strain: Not on file  Food  Insecurity: Not on file  Transportation Needs: Not on file  Physical Activity: Not on file  Stress: Not on file  Social Connections: Not on file  Intimate Partner Violence: Not on file    ROS Review of Systems  Constitutional:  Positive for fatigue. Negative for chills and fever.  HENT:  Positive for congestion and ear pain. Negative for rhinorrhea, sinus pressure and sinus pain.   Respiratory:  Positive for cough, chest tightness and shortness of breath.     Objective:   Today's Vitals: BP (!) 150/72 (BP Location: Left Arm)   Pulse 83   Ht '4\' 11"'$  (1.499 m)   Wt 270 lb (122.5 kg)   SpO2 94%   BMI 54.53 kg/m   Physical Exam HENT:     Head: Normocephalic.     Mouth/Throat:     Mouth: Mucous membranes are moist.  Cardiovascular:     Rate and Rhythm: Normal rate.     Heart sounds: Normal heart sounds.  Pulmonary:     Effort: Pulmonary effort is normal.     Breath sounds: Normal breath sounds.  Neurological:     Mental Status: She is alert.      Assessment & Plan:   Asthma, cold induced, mild intermittent,  with acute exacerbation Assessment & Plan: She complains of fatigue, congestion, chest tightness, cough, shortness of breath, and earache She reports increased use of albuterol inhaler and requests a refill today We will treat with steroid taper A refill of her albuterol inhaler sent to the pharmacy  Orders: -     methylPREDNISolone; Take as the package instructed  Dispense: 1 each; Refill: 0  SOB (shortness of breath) -     Albuterol Sulfate HFA; Inhale 2 puffs into the lungs every 6 (six) hours as needed for wheezing or shortness of breath.  Dispense: 8 g; Refill: 0  Morbid obesity ([place]) Assessment & Plan: She reports increased weight gain and complains of knee pain She reports minimal physical activities but notes adherence to heart healthy diet She was started on Wegovy by her previous provider, but noted that the medication is currently out of  stock Review my weight management plan with the patient Inform patient to increase her physical activities and to inform me when wegvoy is instock    Pain in both knees, unspecified chronicity Assessment & Plan: History of arthritis in the knees She takes meloxicam 7.5 mg daily She has a BMI of 54.53 Encouraged to maintain a healthy weight and to continue taking meloxicam 7.5 mg daily as needed   IFG (impaired fasting glucose) -     Hemoglobin A1c  Vitamin D deficiency -     VITAMIN D 25 Hydroxy (Vit-D Deficiency, Fractures)  Need for hepatitis C screening test -     Hepatitis C antibody  Encounter for screening for HIV -     HIV Antibody (routine testing w rflx)  Other specified hypothyroidism -     TSH + free T4  Other hyperlipidemia -     Lipid panel -     CMP14+EGFR -     CBC with Differential/Platelet     Follow-up: Return in about 3 months (around 06/02/2022).   Alvira Monday, FNP

## 2022-03-07 ENCOUNTER — Other Ambulatory Visit: Payer: Self-pay

## 2022-03-07 ENCOUNTER — Other Ambulatory Visit (HOSPITAL_COMMUNITY): Payer: Self-pay

## 2022-03-07 ENCOUNTER — Encounter: Payer: Self-pay | Admitting: Family Medicine

## 2022-03-07 NOTE — Progress Notes (Unsigned)
Mammogram

## 2022-03-08 ENCOUNTER — Other Ambulatory Visit (HOSPITAL_COMMUNITY): Payer: Self-pay

## 2022-03-10 ENCOUNTER — Other Ambulatory Visit (HOSPITAL_COMMUNITY): Payer: Self-pay

## 2022-03-13 ENCOUNTER — Other Ambulatory Visit (HOSPITAL_COMMUNITY): Payer: Self-pay

## 2022-03-14 ENCOUNTER — Other Ambulatory Visit (HOSPITAL_COMMUNITY): Payer: Self-pay

## 2022-03-27 ENCOUNTER — Ambulatory Visit: Payer: 59 | Admitting: Orthopedic Surgery

## 2022-03-27 ENCOUNTER — Telehealth: Payer: Self-pay | Admitting: Orthopedic Surgery

## 2022-03-27 NOTE — Telephone Encounter (Signed)
Returned the patient's call to reschedule her appt, lvm for her to call us.  She couldn't come today due to she got called into work.

## 2022-04-24 ENCOUNTER — Ambulatory Visit (INDEPENDENT_AMBULATORY_CARE_PROVIDER_SITE_OTHER): Payer: 59 | Admitting: Orthopedic Surgery

## 2022-04-24 DIAGNOSIS — G8929 Other chronic pain: Secondary | ICD-10-CM | POA: Diagnosis not present

## 2022-04-24 DIAGNOSIS — M1712 Unilateral primary osteoarthritis, left knee: Secondary | ICD-10-CM

## 2022-04-24 DIAGNOSIS — M25561 Pain in right knee: Secondary | ICD-10-CM

## 2022-04-24 DIAGNOSIS — M25562 Pain in left knee: Secondary | ICD-10-CM | POA: Diagnosis not present

## 2022-04-24 NOTE — Patient Instructions (Signed)
We will check which brand of Hyaluronic acid injections (there are several) your insurance covers. We will call you with price and schedule with you if insurance approves and you are okay with the out of pocket costs. If for any reason they will not cover or the out of pocket costs are high, we will discuss with you and let you know other options available.

## 2022-04-24 NOTE — Progress Notes (Signed)
Encounter Diagnoses  Name Primary?   Chronic pain of left knee Yes   Unilateral primary osteoarthritis, left knee    Chief Complaint  Patient presents with   Follow-up    Recheck on bilateral knees.     Procedure note for bilateral knee injections  Procedure note left knee injection verbal consent was obtained to inject left knee joint  Timeout was completed to confirm the site of injection  The medications used were 40 mg depomedrol and 3 cc of 1% lidocaine  Anesthesia was provided by ethyl chloride and the skin was prepped with alcohol.  After cleaning the skin with alcohol a 20-gauge needle was used to inject the left knee joint. There were no complications. A sterile bandage was applied.   Procedure note right knee injection verbal consent was obtained to inject right knee joint  Timeout was completed to confirm the site of injection  The medications used were 40 mg depomedrol and 3 cc of 1% lidocaine  Anesthesia was provided by ethyl chloride and the skin was prepped with alcohol.  After cleaning the skin with alcohol a 20-gauge needle was used to inject the right knee joint. There were no complications. A sterile bandage was applied.

## 2022-04-25 ENCOUNTER — Telehealth: Payer: Self-pay

## 2022-04-25 NOTE — Telephone Encounter (Signed)
VOB submitted for Orthovisc, left knee

## 2022-04-25 NOTE — Telephone Encounter (Signed)
-----   Message from Candice Camp, RT sent at 04/24/2022  4:12 PM EST ----- Hyaluronic acid injections left knee needed / I did put in order

## 2022-04-27 ENCOUNTER — Telehealth: Payer: Self-pay

## 2022-04-27 NOTE — Telephone Encounter (Signed)
Faxed completed PA form to [employer] at [phone] for Orthovisc, left knee. PA pending

## 2022-04-28 ENCOUNTER — Telehealth: Payer: Self-pay

## 2022-04-28 ENCOUNTER — Other Ambulatory Visit: Payer: Self-pay

## 2022-04-28 DIAGNOSIS — M1712 Unilateral primary osteoarthritis, left knee: Secondary | ICD-10-CM

## 2022-04-28 NOTE — Telephone Encounter (Signed)
Please schedule patient for gel injection.   All information can be found in referrals.

## 2022-06-01 ENCOUNTER — Encounter: Payer: Self-pay | Admitting: Orthopedic Surgery

## 2022-06-01 ENCOUNTER — Ambulatory Visit (INDEPENDENT_AMBULATORY_CARE_PROVIDER_SITE_OTHER): Payer: 59 | Admitting: Orthopedic Surgery

## 2022-06-01 DIAGNOSIS — M1712 Unilateral primary osteoarthritis, left knee: Secondary | ICD-10-CM | POA: Diagnosis not present

## 2022-06-01 DIAGNOSIS — G8929 Other chronic pain: Secondary | ICD-10-CM

## 2022-06-01 MED ORDER — HYALURONAN 30 MG/2ML IX SOSY
30.0000 mg | PREFILLED_SYRINGE | Freq: Once | INTRA_ARTICULAR | Status: AC
  Administered 2022-06-01: 30 mg via INTRA_ARTICULAR

## 2022-06-01 NOTE — Progress Notes (Signed)
Chief Complaint  Patient presents with   Injections    Orthovisc #1 left knee, had increased pain after the last depo medrol injection especially at night     52 year old female chronic left knee pain from osteoarthritis  Not quite surgical candidate at this time wishes to postpone surgical intervention the patient has consented to and requested hyaluronic acid injection in the form of  Orthovisc    The left  knee is prepped with alcohol and ethyl chloride  The injection is performed with a 21-gauge needle, via inferolateral approach  No complications were noted  Appropriate instructions post injection were given follow-up in a week  Encounter Diagnoses  Name Primary?   Unilateral primary osteoarthritis, left knee Yes   Chronic pain of left knee

## 2022-06-02 ENCOUNTER — Ambulatory Visit: Payer: 59 | Admitting: Family Medicine

## 2022-06-08 ENCOUNTER — Ambulatory Visit (INDEPENDENT_AMBULATORY_CARE_PROVIDER_SITE_OTHER): Payer: 59 | Admitting: Orthopedic Surgery

## 2022-06-08 DIAGNOSIS — M1712 Unilateral primary osteoarthritis, left knee: Secondary | ICD-10-CM | POA: Diagnosis not present

## 2022-06-08 MED ORDER — HYALURONAN 30 MG/2ML IX SOSY
30.0000 mg | PREFILLED_SYRINGE | Freq: Once | INTRA_ARTICULAR | Status: AC
  Administered 2022-06-08: 30 mg via INTRA_ARTICULAR

## 2022-06-08 NOTE — Progress Notes (Signed)
Chief Complaint  Patient presents with   Injections    Orthovisc 2 left knee    Encounter Diagnosis  Name Primary?   Unilateral primary osteoarthritis, left knee Yes   Melanie Boyd comes in for her second Orthovisc injection left knee.  She says she is already seeing relief.  She consented for injection Orthovisc left knee.  We prepped the knee with alcohol and ethyl chloride and then injected the left knee through a lateral approach 1 vial of Orthovisc.  She was observed for complications and there were none.  She will come in next week for the third injection

## 2022-06-15 ENCOUNTER — Ambulatory Visit (INDEPENDENT_AMBULATORY_CARE_PROVIDER_SITE_OTHER): Payer: 59 | Admitting: Orthopedic Surgery

## 2022-06-15 ENCOUNTER — Encounter: Payer: Self-pay | Admitting: Orthopedic Surgery

## 2022-06-15 ENCOUNTER — Encounter (INDEPENDENT_AMBULATORY_CARE_PROVIDER_SITE_OTHER): Payer: Self-pay | Admitting: *Deleted

## 2022-06-15 DIAGNOSIS — M1712 Unilateral primary osteoarthritis, left knee: Secondary | ICD-10-CM

## 2022-06-15 DIAGNOSIS — G8929 Other chronic pain: Secondary | ICD-10-CM

## 2022-06-15 MED ORDER — HYALURONAN 30 MG/2ML IX SOSY
30.0000 mg | PREFILLED_SYRINGE | Freq: Once | INTRA_ARTICULAR | Status: AC
  Administered 2022-06-15: 30 mg via INTRA_ARTICULAR

## 2022-06-15 NOTE — Progress Notes (Signed)
Chief Complaint  Patient presents with   Injections    Left knee orthovisc 3    Procedure note for injection of hyaluronic acid   Diagnosis osteoarthritis of the knee  Verbal consent was obtained to inject the knee with HYALURONIC ACID . Timeout was completed to confirm the injection site as the left    Knee  Ethyl chloride spray was used for anesthesia Alcohol was used to prep the skin. The infrapatellar lateral portal was used as an injection site and 1 vial of hyaluronic acid  was injected into the knee  Specific Co. Preparation: orthovisc   No complications were noted

## 2022-07-13 ENCOUNTER — Encounter: Payer: Self-pay | Admitting: Orthopedic Surgery

## 2022-07-13 ENCOUNTER — Ambulatory Visit (INDEPENDENT_AMBULATORY_CARE_PROVIDER_SITE_OTHER): Payer: 59 | Admitting: Orthopedic Surgery

## 2022-07-13 DIAGNOSIS — M25562 Pain in left knee: Secondary | ICD-10-CM

## 2022-07-13 DIAGNOSIS — G8929 Other chronic pain: Secondary | ICD-10-CM | POA: Diagnosis not present

## 2022-07-13 DIAGNOSIS — M1712 Unilateral primary osteoarthritis, left knee: Secondary | ICD-10-CM

## 2022-07-13 DIAGNOSIS — M1710 Unilateral primary osteoarthritis, unspecified knee: Secondary | ICD-10-CM

## 2022-07-13 MED ORDER — METHYLPREDNISOLONE ACETATE 40 MG/ML IJ SUSP
40.0000 mg | Freq: Once | INTRAMUSCULAR | Status: AC
  Administered 2022-07-13: 40 mg via INTRA_ARTICULAR

## 2022-07-13 NOTE — Patient Instructions (Signed)
Treatment  Recommend 100 pound weight loss and then you can have a knee replacement to help your knee pain  But in the meantime  Every 3 months you can repeat your hyaluronic acid injections and every 3 months in between you can get a cortisone injection  Take Tylenol 500 mg every 6 hours  And take the arthritis medicine

## 2022-07-13 NOTE — Progress Notes (Signed)
Chief Complaint  Patient presents with   Knee Pain    Left/ Orthovisc helped some but still has pain    52 year old female with osteoarthritis of the left knee  Her x-ray has grade 2/3 disease  She had her Orthovisc injections.  Her BMI is 54  She does get relief from the meloxicam  Options for her she has to get down to a BMI of 35 She can have alternating injections of cortisone and hyaluronic acid Take anti-inflammatory and Tylenol  She will continue on the meloxicam and the Tylenol  I injected her left knee today  She is going to bring in FMLA papers to limit her shifts to 8 hours only or less and no more than 5 consecutive days secondary to osteoarthritis of the knee  Encounter Diagnoses  Name Primary?   Primary localized osteoarthritis of knee Yes   Chronic pain of left knee    Morbid obesity (HCC)

## 2022-07-14 ENCOUNTER — Telehealth: Payer: Self-pay

## 2022-07-14 NOTE — Telephone Encounter (Signed)
-----   Message from [facility] May, RT sent at 07/13/2022  3:52 PM EDT -----  ----- Message ----- From: Vickki Hearing, MD Sent: 07/13/2022   3:47 PM EDT To: Cherre Huger, RT  Needs hyaluronic acid injection in 3 months

## 2022-07-14 NOTE — Telephone Encounter (Signed)
Will submit for Orthovisc, left knee at the of July, 2024.

## 2022-07-18 ENCOUNTER — Other Ambulatory Visit: Payer: Self-pay | Admitting: Orthopedic Surgery

## 2022-07-20 ENCOUNTER — Ambulatory Visit: Payer: 59 | Admitting: Orthopedic Surgery

## 2022-07-27 ENCOUNTER — Other Ambulatory Visit: Payer: Self-pay | Admitting: Family Medicine

## 2022-08-03 ENCOUNTER — Telehealth: Payer: Self-pay | Admitting: Family Medicine

## 2022-08-03 NOTE — Telephone Encounter (Signed)
Per office policy, please contact patient to determine the reason for the request.  Forward to current PCP for review prior to transferring care with requested provider.

## 2022-08-03 NOTE — Telephone Encounter (Signed)
I saw this patient at [facility] and she asked med about her med refill.  I am not sure what she needs so I called her and asked her to call back the office.  She is requesting Dr Durwin Nora as her PCP MD.

## 2022-08-04 NOTE — Telephone Encounter (Signed)
I spoke with Johann Capers, who informed me that the patient requested a change in provider because she met Dr. Jaci Lazier at [facility] and was fond of him. The switch is acceptable.

## 2022-08-09 NOTE — Telephone Encounter (Signed)
Patient approved to transfer care to Dr. Durwin Nora

## 2022-08-09 NOTE — Telephone Encounter (Signed)
Patient would like to switch care from NP to MD. Please confirm, if agreeable.

## 2022-08-10 NOTE — Telephone Encounter (Signed)
Called patient left a voicemail for patient to call our office to schedule an in office visit with Dr Christel Mormon.

## 2022-08-19 ENCOUNTER — Ambulatory Visit
Admission: EM | Admit: 2022-08-19 | Discharge: 2022-08-19 | Disposition: A | Discharge status: Home / Self Care | Payer: 59 | Attending: Family Medicine | Admitting: Family Medicine

## 2022-08-19 DIAGNOSIS — Z1152 Encounter for screening for COVID-19: Secondary | ICD-10-CM | POA: Diagnosis not present

## 2022-08-19 DIAGNOSIS — J069 Acute upper respiratory infection, unspecified: Secondary | ICD-10-CM | POA: Diagnosis not present

## 2022-08-19 LAB — POCT RAPID STREP A (OFFICE): Rapid Strep A Screen: NEGATIVE

## 2022-08-19 MED ORDER — LIDOCAINE VISCOUS HCL 2 % MT SOLN: 10.0000 mL | OROMUCOSAL | 0 refills | Status: DC | PRN

## 2022-08-19 NOTE — ED Triage Notes (Signed)
Pt reports mid abdominal pain,throat pain, left side ear pain,cough,diarrhea  and fever x 2 days. Took alketerzer puls

## 2022-08-20 LAB — SARS CORONAVIRUS 2 (TAT 6-24 HRS): SARS Coronavirus 2: NEGATIVE

## 2022-08-20 NOTE — ED Provider Notes (Signed)
[facility]    CSN: [id number] Arrival date & time: 08/19/22  1330      History   Chief Complaint Chief Complaint  Patient presents with   Sore Throat    HPI Harmonei Calzado is a 52 y.o. female.   Patient presenting today with 2-day history of abdominal discomfort, sore throat, ear pain, cough, diarrhea, fevers off-and-on.  Denies chest pain, shortness of breath, vomiting, diarrhea.  Trying Alka-Seltzer plus with mild temporary benefit.  No known sick contacts recently.  History of asthma on albuterol as needed.    Past Medical History:  Diagnosis Date   Anemia    Asthma    Hypertension    Shortness of breath     Patient Active Problem List   Diagnosis Date Noted   Morbid obesity (HCC) 03/03/2022   Asthma, cold induced, mild intermittent, with acute exacerbation 03/03/2022   Bilateral knee pain 03/03/2022   Sebaceous cyst    Abdominal pain 05/30/2012   Nausea with vomiting 05/30/2012   Bronchitis, acute 12/05/2010   Asthma attack 12/03/2010   Anemia 12/03/2010   Hyperglycemia 12/03/2010    Past Surgical History:  Procedure Laterality Date   COLONOSCOPY N/A 07/11/2012   Procedure: COLONOSCOPY;  Surgeon: Malissa Hippo, MD;  Location: [facility];  Service: Endoscopy;  Laterality: N/A;  730   MASS EXCISION Right 06/07/2016   Procedure: EXCISION 1.5 CM MASS RIGHT THIGH;  Surgeon: Franky Macho, MD;  Location: [facility];  Service: General;  Laterality: Right;   TUBAL LIGATION      OB History   No obstetric history on file.      Home Medications    Prior to Admission medications   Medication Sig Start Date End Date Taking? Authorizing Provider  lidocaine (XYLOCAINE) 2 % solution Use as directed 10 mLs in the mouth or throat every 3 (three) hours as needed for mouth pain. 08/19/22  Yes Particia Nearing, PA-C  albuterol (PROVENTIL) (2.5 MG/3ML) 0.083% nebulizer solution Use 1 vial (3 mLs) in nebulizer every 6 (six) hours as needed 06/27/21      albuterol (PROVENTIL) 4 MG tablet Take 1 tablet (4 mg total) by mouth 3 (three) times daily as needed. 11/30/21     albuterol (VENTOLIN HFA) 108 (90 Base) MCG/ACT inhaler Inhale 2 puffs into the lungs 4 (four) times daily as needed. 04/20/21     albuterol (VENTOLIN HFA) 108 (90 Base) MCG/ACT inhaler Inhale 2 puffs into the lungs every 6 (six) hours as needed for wheezing or shortness of breath. 03/03/22   Gilmore Laroche, FNP  cetirizine (ZYRTEC ALLERGY) 10 MG tablet Take 1 tablet (10 mg total) by mouth daily. 01/05/20   Avegno, Zachery Dakins, FNP  furosemide (LASIX) 20 MG tablet Take 1/2 of a pill daily 10/11/18   Bethann Berkshire, MD  furosemide (LASIX) 40 MG tablet TAKE ONE TABLET (40MG ) BY MOUTH ONCE A DAY AS NEEDED 07/28/22   Gilmore Laroche, FNP  ibuprofen (ADVIL) 600 MG tablet Take 600 mg by mouth every 6 (six) hours as needed.    [provider]  meloxicam (MOBIC) 7.5 MG tablet TAKE ONE TABLET (7.5MG  TOTAL) BY MOUTH DAILY 07/18/22   Vickki Hearing, MD  methylPREDNISolone (MEDROL DOSEPAK) 4 MG TBPK tablet Take as the package instructed 03/03/22   Gilmore Laroche, FNP  Semaglutide-Weight Management 0.25 MG/0.5ML SOAJ Inject 0.25 mg into the skin once a week. 11/29/21       Family History Family History  Problem Relation Age  of Onset   Healthy Mother    Healthy Father     Social History Social History   Tobacco Use   Smoking status: Never   Smokeless tobacco: Never  Vaping Use   Vaping Use: Never used  Substance Use Topics   Alcohol use: No   Drug use: No     Allergies   Gabapentin   Review of Systems Review of Systems PER HPI  Physical Exam Triage Vital Signs ED Triage Vitals  Enc Vitals Group     BP 08/19/22 1357 (!) 167/84     Pulse Rate 08/19/22 1357 73     Resp 08/19/22 1357 14     Temp 08/19/22 1357 98.1 F (36.7 C)     Temp Source 08/19/22 1357 Oral     SpO2 08/19/22 1357 94 %     Weight --      Height --      Head Circumference --      Peak Flow  --      Pain Score 08/19/22 1359 5     Pain Loc --      Pain Edu? --      Excl. in GC? --    No data found.  Updated Vital Signs BP (!) 167/84 (BP Location: Right Wrist)   Pulse 73   Temp 98.1 F (36.7 C) (Oral)   Resp 14   SpO2 94%   Visual Acuity Right Eye Distance:   Left Eye Distance:   Bilateral Distance:    Right Eye Near:   Left Eye Near:    Bilateral Near:     Physical Exam Vitals and nursing note reviewed.  Constitutional:      Appearance: Normal appearance.  HENT:     Head: Atraumatic.     Right Ear: Tympanic membrane and external ear normal.     Left Ear: Tympanic membrane and external ear normal.     Nose: Rhinorrhea present.     Mouth/Throat:     Mouth: Mucous membranes are moist.     Pharynx: Posterior oropharyngeal erythema present.  Eyes:     Extraocular Movements: Extraocular movements intact.     Conjunctiva/sclera: Conjunctivae normal.  Cardiovascular:     Rate and Rhythm: Normal rate and regular rhythm.     Heart sounds: Normal heart sounds.  Pulmonary:     Effort: Pulmonary effort is normal.     Breath sounds: Normal breath sounds. No wheezing or rales.  Musculoskeletal:        General: Normal range of motion.     Cervical back: Normal range of motion and neck supple.  Skin:    General: Skin is warm and dry.  Neurological:     Mental Status: She is alert and oriented to person, place, and time.  Psychiatric:        Mood and Affect: Mood normal.        Thought Content: Thought content normal.      UC Treatments / Results  Labs (all labs ordered are listed, but only abnormal results are displayed) Labs Reviewed  SARS CORONAVIRUS 2 (TAT 6-24 HRS)  POCT RAPID STREP A (OFFICE)    EKG   Radiology No results found.  Procedures Procedures (including critical care time)  Medications Ordered in UC Medications - No data to display  Initial Impression / Assessment and Plan / UC Course  I have reviewed the triage vital signs and  the nursing notes.  Pertinent labs & imaging results that  were available during my care of the patient were reviewed by me and considered in my medical decision making (see chart for details).     Overall vitals and exam are reassuring today, suspect viral upper respiratory infection.  Rapid strep negative, COVID testing pending.  Treat with viscous lidocaine, DayQuil, NyQuil and other supportive over-the-counter medications.  Return for worsening symptoms.  Final Clinical Impressions(s) / UC Diagnoses   Final diagnoses:  Viral URI with cough   Discharge Instructions   None    ED Prescriptions     Medication Sig Dispense Auth. Provider   lidocaine (XYLOCAINE) 2 % solution Use as directed 10 mLs in the mouth or throat every 3 (three) hours as needed for mouth pain. 100 mL Particia Nearing, [place]      PDMP not reviewed this encounter.   Particia Nearing, [place] 08/20/22 1146

## 2022-09-08 ENCOUNTER — Telehealth: Payer: Self-pay | Admitting: Orthopedic Surgery

## 2022-09-08 NOTE — Telephone Encounter (Signed)
[employer] forms received. To [employer]. 

## 2022-09-14 ENCOUNTER — Ambulatory Visit: Payer: 59 | Admitting: Orthopedic Surgery

## 2022-10-10 ENCOUNTER — Telehealth: Payer: Self-pay

## 2022-10-10 NOTE — Telephone Encounter (Signed)
VOB submitted for Orthovisc, left knee.  

## 2022-10-13 ENCOUNTER — Telehealth: Payer: Self-pay | Admitting: Orthopedic Surgery

## 2022-10-13 NOTE — Telephone Encounter (Signed)
Yes, it will be for Orthovisc.

## 2022-10-13 NOTE — Telephone Encounter (Signed)
Patient sch for HA injections On Monday Is she approved? Which one are we doing?

## 2022-10-16 ENCOUNTER — Encounter: Payer: Self-pay | Admitting: Orthopedic Surgery

## 2022-10-16 ENCOUNTER — Ambulatory Visit (INDEPENDENT_AMBULATORY_CARE_PROVIDER_SITE_OTHER): Payer: 59 | Admitting: Orthopedic Surgery

## 2022-10-16 ENCOUNTER — Telehealth: Payer: Self-pay

## 2022-10-16 VITALS — BP 155/83 | HR 69 | Ht 60.0 in | Wt 271.0 lb

## 2022-10-16 DIAGNOSIS — G8929 Other chronic pain: Secondary | ICD-10-CM | POA: Diagnosis not present

## 2022-10-16 DIAGNOSIS — M25562 Pain in left knee: Secondary | ICD-10-CM | POA: Diagnosis not present

## 2022-10-16 DIAGNOSIS — M1710 Unilateral primary osteoarthritis, unspecified knee: Secondary | ICD-10-CM | POA: Diagnosis not present

## 2022-10-16 MED ORDER — METHYLPREDNISOLONE ACETATE 40 MG/ML IJ SUSP
40.0000 mg | Freq: Once | INTRAMUSCULAR | Status: AC
Start: 2022-10-16 — End: 2022-10-16
  Administered 2022-10-16: 40 mg via INTRA_ARTICULAR

## 2022-10-16 MED ORDER — MELOXICAM 7.5 MG PO TABS
7.5000 mg | ORAL_TABLET | Freq: Two times a day (BID) | ORAL | 5 refills | Status: DC
Start: 2022-10-16 — End: 2024-01-08

## 2022-10-16 NOTE — Progress Notes (Signed)
   VISIT TYPE: FOLLOW UP   Chief Complaint  Patient presents with   Knee Pain    Pain in the right knee    Encounter Diagnoses  Name Primary?   Primary localized osteoarthritis of knee Yes   Chronic pain of left knee    Morbid obesity (HCC)     Assessment and Plan: 52 year old female with chronic knee pain from osteoarthritis.  Exacerbation of symptoms requiring change in treatment.  Repeat injection and increase medication to twice a day with meloxicam  Come back in a month to repeat the injection and then in October we can start the gel shots again   Prior treatment:  Injection, hyaluronic acid, meloxicam 7.5 daily   HPI: 52 year old female with obesity and comes in with worsening pain in her left knee status post injection.  She would like to get a gel injection but it is too early.    BP (!) 155/83   Pulse 69   Ht 5' (1.524 m)   Wt 271 lb (122.9 kg)   BMI 52.93 kg/m   Left Knee Exam   Muscle Strength  The patient has normal left knee strength.  Tenderness  The patient is experiencing tenderness in the medial joint line.  Range of Motion  Extension:  normal  Left knee flexion: 120-125.   Tests  Drawer:  Anterior - negative     Posterior - negative  Other  Erythema: absent Sensation: normal Pulse: present Swelling: none Effusion: no effusion present     Imaging severe OA both knees  A/P Encounter Diagnoses  Name Primary?   Primary localized osteoarthritis of knee Yes   Chronic pain of left knee    Morbid obesity (HCC)     No orders of the defined types were placed in this encounter.  Procedure note left knee injection   verbal consent was obtained to inject left knee joint  Timeout was completed to confirm the site of injection  The medications used were depomedrol 40 mg and 1% lidocaine 3 cc Anesthesia was provided by ethyl chloride and the skin was prepped with alcohol.  After cleaning the skin with alcohol a 20-gauge needle was  used to inject the left knee joint. There were no complications. A sterile bandage was applied.

## 2022-10-16 NOTE — Telephone Encounter (Signed)
Next available Orthovisc for left knee would need to be after 12/15/2022 due to last injection being done on 06/15/2022.  Will check benefits in September, 2024.

## 2022-11-20 ENCOUNTER — Telehealth: Payer: Self-pay | Admitting: Radiology

## 2022-11-20 ENCOUNTER — Encounter: Payer: Self-pay | Admitting: Orthopedic Surgery

## 2022-11-20 ENCOUNTER — Ambulatory Visit (INDEPENDENT_AMBULATORY_CARE_PROVIDER_SITE_OTHER): Payer: 59 | Admitting: Orthopedic Surgery

## 2022-11-20 VITALS — Ht 60.0 in | Wt 274.0 lb

## 2022-11-20 DIAGNOSIS — M170 Bilateral primary osteoarthritis of knee: Secondary | ICD-10-CM | POA: Diagnosis not present

## 2022-11-20 DIAGNOSIS — G8929 Other chronic pain: Secondary | ICD-10-CM

## 2022-11-20 MED ORDER — METHYLPREDNISOLONE ACETATE 40 MG/ML IJ SUSP
40.0000 mg | Freq: Once | INTRAMUSCULAR | Status: AC
Start: 2022-11-20 — End: 2022-11-20
  Administered 2022-11-20: 40 mg via INTRA_ARTICULAR

## 2022-11-20 NOTE — Telephone Encounter (Signed)
Dr Romeo Apple filled out disability forms, accomodation forms and had me fax them to [phone]. I faxed them told him that is not the preferred way to do these, he states he is aware. He asked me to fax, I did and gave the original to patient.

## 2022-11-20 NOTE — Progress Notes (Signed)
Chief Complaint  Patient presents with   Injections    Bilateral knees also needs work accommodations/ note to not work more than 4 days in a row without a day off    Encounter Diagnoses  Name Primary?   Chronic pain of right knee Yes   Chronic pain of left knee    Bilateral primary osteoarthritis of knee     Procedure note for bilateral knee injections  Procedure note left knee injection verbal consent was obtained to inject left knee joint  Timeout was completed to confirm the site of injection  The medications used were 40 mg depomedrol and 3 cc of 1% lidocaine  Anesthesia was provided by ethyl chloride and the skin was prepped with alcohol.  After cleaning the skin with alcohol a 20-gauge needle was used to inject the left knee joint. There were no complications. A sterile bandage was applied.   Procedure note right knee injection verbal consent was obtained to inject right knee joint  Timeout was completed to confirm the site of injection  The medications used were 40 mg depomedrol and 3 cc of 1% lidocaine  Anesthesia was provided by ethyl chloride and the skin was prepped with alcohol.  After cleaning the skin with alcohol a 20-gauge needle was used to inject the right knee joint. There were no complications. A sterile bandage was applied.

## 2022-12-06 ENCOUNTER — Other Ambulatory Visit: Payer: Self-pay | Admitting: Family Medicine

## 2022-12-15 ENCOUNTER — Telehealth: Payer: Self-pay

## 2022-12-15 NOTE — Telephone Encounter (Signed)
Faxed completed PA form to [facility] for Orthovisc, left knee PA pending

## 2023-02-01 ENCOUNTER — Other Ambulatory Visit: Payer: Self-pay

## 2023-02-01 ENCOUNTER — Telehealth: Payer: Self-pay

## 2023-02-01 DIAGNOSIS — M1712 Unilateral primary osteoarthritis, left knee: Secondary | ICD-10-CM

## 2023-02-01 NOTE — Telephone Encounter (Signed)
Not sure how I forgot this one, but she is approved to have gel injection with Dr. Romeo Apple. She will need 3 appts.  All gel information has been added under the referrals tab.

## 2023-05-09 ENCOUNTER — Ambulatory Visit
Admission: EM | Admit: 2023-05-09 | Discharge: 2023-05-09 | Disposition: A | Discharge status: Home / Self Care | Attending: Nurse Practitioner | Admitting: Nurse Practitioner

## 2023-05-09 DIAGNOSIS — J4541 Moderate persistent asthma with (acute) exacerbation: Secondary | ICD-10-CM | POA: Diagnosis not present

## 2023-05-09 DIAGNOSIS — J069 Acute upper respiratory infection, unspecified: Secondary | ICD-10-CM | POA: Diagnosis not present

## 2023-05-09 LAB — POC COVID19/FLU A&B COMBO
Covid Antigen, POC: NEGATIVE
Influenza A Antigen, POC: NEGATIVE
Influenza B Antigen, POC: NEGATIVE

## 2023-05-09 MED ORDER — ALBUTEROL SULFATE (2.5 MG/3ML) 0.083% IN NEBU: 2.5000 mg | INHALATION_SOLUTION | Freq: Four times a day (QID) | RESPIRATORY_TRACT | 0 refills | Status: DC | PRN

## 2023-05-09 MED ORDER — METHYLPREDNISOLONE SODIUM SUCC 125 MG IJ SOLR
125.0000 mg | Freq: Once | INTRAMUSCULAR | Status: AC
  Administered 2023-05-09: 125 mg via INTRAMUSCULAR

## 2023-05-09 MED ORDER — ALBUTEROL SULFATE HFA 108 (90 BASE) MCG/ACT IN AERS: 2.0000 | INHALATION_SPRAY | Freq: Four times a day (QID) | RESPIRATORY_TRACT | 0 refills | Status: DC | PRN

## 2023-05-09 MED ORDER — FLUTICASONE PROPIONATE 50 MCG/ACT NA SUSP: 2.0000 | Freq: Every day | NASAL | 0 refills | Status: AC

## 2023-05-09 MED ORDER — PREDNISONE 50 MG PO TABS: ORAL_TABLET | ORAL | 0 refills | Status: AC

## 2023-05-09 MED ORDER — IPRATROPIUM-ALBUTEROL 0.5-2.5 (3) MG/3ML IN SOLN
3.0000 mL | Freq: Once | RESPIRATORY_TRACT | Status: AC
  Administered 2023-05-09: 3 mL via RESPIRATORY_TRACT

## 2023-05-09 MED ORDER — PROMETHAZINE-DM 6.25-15 MG/5ML PO SYRP: 5.0000 mL | ORAL_SOLUTION | Freq: Four times a day (QID) | ORAL | 0 refills | Status: DC | PRN

## 2023-05-09 NOTE — Discharge Instructions (Addendum)
 The COVID/flu test was negative. You were given an injection of Solu-Medrol 125 mg along with a DuoNeb.  Start the prednisone on 05/10/2023. May take over-the-counter Tylenol or ibuprofen as needed for pain, fever, or general discomfort. Increase fluids and allow for plenty of rest. Recommend the use of normal saline nasal spray for nasal congestion and runny nose. For your cough, recommend use of a humidifier in your bedroom at nighttime during sleep and sleeping elevated on pillows while cough symptoms persist. Go to the emergency department immediately if you experience worsening shortness of breath, wheezing, difficulty breathing, or other concerns. Follow-up with your primary care physician within the next 7 to 10 days for reevaluation. Follow-up as needed.

## 2023-05-09 NOTE — ED Provider Notes (Signed)
 [facility]    CSN: [id number] Arrival date & time: 05/09/23  1338      History   Chief Complaint No chief complaint on file.   HPI [facility] is a 53 y.o. female.   The history is provided by the patient.   Patient presents with a 2-day history of fever, body aches, headache, nasal congestion, and cough.  Patient states over the past 24 hours, she has since developed shortness of breath, tightness in the chest, and difficulty breathing.  Denies ear drainage, chest pain, abdominal pain, nausea, vomiting, diarrhea, or rash.  Patient with underlying history of asthma.  States she has been taking over-the-counter cough and cold medications, and using her nebulizer machine at home.  Past Medical History:  Diagnosis Date   Anemia    Asthma    Hypertension    Shortness of breath     Patient Active Problem List   Diagnosis Date Noted   Morbid obesity (HCC) 03/03/2022   Asthma, cold induced, mild intermittent, with acute exacerbation 03/03/2022   Bilateral knee pain 03/03/2022   Sebaceous cyst    Abdominal pain 05/30/2012   Nausea with vomiting 05/30/2012   Bronchitis, acute 12/05/2010   Asthma attack 12/03/2010   Anemia 12/03/2010   Hyperglycemia 12/03/2010    Past Surgical History:  Procedure Laterality Date   COLONOSCOPY N/A 07/11/2012   Procedure: COLONOSCOPY;  Surgeon: Malissa Hippo, MD;  Location: [facility];  Service: Endoscopy;  Laterality: N/A;  730   MASS EXCISION Right 06/07/2016   Procedure: EXCISION 1.5 CM MASS RIGHT THIGH;  Surgeon: Franky Macho, MD;  Location: [facility];  Service: General;  Laterality: Right;   TUBAL LIGATION      OB History   No obstetric history on file.      Home Medications    Prior to Admission medications   Medication Sig Start Date End Date Taking? Authorizing Provider  albuterol (PROVENTIL) (2.5 MG/3ML) 0.083% nebulizer solution Use 1 vial (3 mLs) in nebulizer every 6 (six) hours as needed 06/27/21      albuterol (PROVENTIL) 4 MG tablet Take 1 tablet (4 mg total) by mouth 3 (three) times daily as needed. 11/30/21     albuterol (VENTOLIN HFA) 108 (90 Base) MCG/ACT inhaler Inhale 2 puffs into the lungs 4 (four) times daily as needed. 04/20/21     albuterol (VENTOLIN HFA) 108 (90 Base) MCG/ACT inhaler Inhale 2 puffs into the lungs every 6 (six) hours as needed for wheezing or shortness of breath. 03/03/22   Gilmore Laroche, FNP  cetirizine (ZYRTEC ALLERGY) 10 MG tablet Take 1 tablet (10 mg total) by mouth daily. 01/05/20   Avegno, Zachery Dakins, FNP  furosemide (LASIX) 20 MG tablet Take 1/2 of a pill daily 10/11/18   Bethann Berkshire, MD  furosemide (LASIX) 40 MG tablet TAKE ONE TABLET (40MG ) BY MOUTH ONCE A DAY AS NEEDED 12/06/22   Gilmore Laroche, FNP  ibuprofen (ADVIL) 600 MG tablet Take 600 mg by mouth every 6 (six) hours as needed.    [provider]  lidocaine (XYLOCAINE) 2 % solution Use as directed 10 mLs in the mouth or throat every 3 (three) hours as needed for mouth pain. 08/19/22   Particia Nearing, PA-C  meloxicam (MOBIC) 7.5 MG tablet Take 1 tablet (7.5 mg total) by mouth 2 (two) times daily. 10/16/22   Vickki Hearing, MD  methylPREDNISolone (MEDROL DOSEPAK) 4 MG TBPK tablet Take as the package instructed 03/03/22   Denny Levy,  Malachi Bonds, FNP  Semaglutide-Weight Management 0.25 MG/0.5ML SOAJ Inject 0.25 mg into the skin once a week. 11/29/21       Family History Family History  Problem Relation Age of Onset   Healthy Mother    Healthy Father     Social History Social History   Tobacco Use   Smoking status: Never   Smokeless tobacco: Never  Vaping Use   Vaping status: Never Used  Substance Use Topics   Alcohol use: No   Drug use: No     Allergies   Gabapentin   Review of Systems Review of Systems Per HPI  Physical Exam Triage Vital Signs ED Triage Vitals  Encounter Vitals Group     BP 05/09/23 1342 (!) 162/85     Systolic BP Percentile --      Diastolic  BP Percentile --      Pulse Rate 05/09/23 1342 72     Resp 05/09/23 1342 (!) 24     Temp 05/09/23 1342 99.6 F (37.6 C)     Temp Source 05/09/23 1342 Oral     SpO2 05/09/23 1342 96 %     Weight --      Height --      Head Circumference --      Peak Flow --      Pain Score 05/09/23 1346 4     Pain Loc --      Pain Education --      Exclude from Growth Chart --    No data found.  Updated Vital Signs BP (!) 162/85 (BP Location: Right Arm)   Pulse 72   Temp 99.6 F (37.6 C) (Oral)   Resp (!) 24   SpO2 95% Comment: post neb tx.  Visual Acuity Right Eye Distance:   Left Eye Distance:   Bilateral Distance:    Right Eye Near:   Left Eye Near:    Bilateral Near:     Physical Exam Vitals and nursing note reviewed.  Constitutional:      General: She is not in acute distress.    Appearance: Normal appearance.  HENT:     Head: Normocephalic.     Right Ear: Tympanic membrane, ear canal and external ear normal.     Left Ear: Tympanic membrane, ear canal and external ear normal.     Nose: Congestion present.     Right Turbinates: Enlarged and swollen.     Left Turbinates: Enlarged and swollen.     Right Sinus: No maxillary sinus tenderness or frontal sinus tenderness.     Left Sinus: No maxillary sinus tenderness or frontal sinus tenderness.     Mouth/Throat:     Lips: Pink.     Mouth: Mucous membranes are moist.     Pharynx: Uvula midline. Posterior oropharyngeal erythema and postnasal drip present. No pharyngeal swelling, oropharyngeal exudate or uvula swelling.  Eyes:     Extraocular Movements: Extraocular movements intact.     Conjunctiva/sclera: Conjunctivae normal.     Pupils: Pupils are equal, round, and reactive to light.  Cardiovascular:     Rate and Rhythm: Normal rate and regular rhythm.     Pulses: Normal pulses.     Heart sounds: Normal heart sounds.  Pulmonary:     Effort: Pulmonary effort is normal.     Breath sounds: Normal breath sounds.     Comments:  DuoNeb administered for wheezing, shortness of breath and chest tightness.  Post DuoNeb, patient with increased wheezing noted throughout.  Abdominal:     General: Bowel sounds are normal.     Palpations: Abdomen is soft.     Tenderness: There is no abdominal tenderness.  Musculoskeletal:     Cervical back: Normal range of motion.  Lymphadenopathy:     Cervical: No cervical adenopathy.  Skin:    General: Skin is warm and dry.  Neurological:     General: No focal deficit present.     Mental Status: She is alert and oriented to person, place, and time.  Psychiatric:        Mood and Affect: Mood normal.        Behavior: Behavior normal.      UC Treatments / Results  Labs (all labs ordered are listed, but only abnormal results are displayed) Labs Reviewed  POC COVID19/FLU A&B COMBO    EKG   Radiology No results found.  Procedures Procedures (including critical care time)  Medications Ordered in UC Medications  methylPREDNISolone sodium succinate (SOLU-MEDROL) 125 mg/2 mL injection 125 mg (125 mg Intramuscular Given 05/09/23 1430)  ipratropium-albuterol (DUONEB) 0.5-2.5 (3) MG/3ML nebulizer solution 3 mL (3 mLs Nebulization Given 05/09/23 1430)    Initial Impression / Assessment and Plan / UC Course  I have reviewed the triage vital signs and the nursing notes.  Pertinent labs & imaging results that were available during my care of the patient were reviewed by me and considered in my medical decision making (see chart for details).  COVID/flu test was negative.  On exam, patient with expiratory wheezing noted throughout.  DuoNeb administered along with Solu-Medrol 125 mg IM.  Patient reports some improvement of breathing post DuoNeb.  Suspect exacerbation of patient's asthma with underlying viral illness and cough.  Will start prednisone 50 mg for the next 5 days for asthma exacerbation.  Promethazine DM prescribed for cough.  Supportive care recommendations were provided and  discussed with the patient to include fluids, rest, continuing over-the-counter analgesics, and use of a humidifier during sleep.  Discussed indications regarding follow-up.  Patient was in agreement with this plan of care and verbalized understanding.  All questions were answered.  Patient stable for discharge.  Final Clinical Impressions(s) / UC Diagnoses   Final diagnoses:  None   Discharge Instructions   None    ED Prescriptions   None    PDMP not reviewed this encounter.   Abran Cantor, NP 05/09/23 [phone]

## 2023-05-09 NOTE — ED Triage Notes (Signed)
 Pt reports cough, congestion, fever, body aches headache x 2 days SOB tightness in the chest and difficulty breathing x 1 day.

## 2023-05-11 ENCOUNTER — Ambulatory Visit: Payer: Self-pay

## 2023-05-11 ENCOUNTER — Emergency Department (HOSPITAL_COMMUNITY)
Admission: EM | Admit: 2023-05-11 | Discharge: 2023-05-11 | Disposition: A | Discharge status: Home / Self Care | Attending: Emergency Medicine | Admitting: Emergency Medicine

## 2023-05-11 ENCOUNTER — Emergency Department (HOSPITAL_COMMUNITY)

## 2023-05-11 ENCOUNTER — Encounter (HOSPITAL_COMMUNITY): Payer: Self-pay | Admitting: Emergency Medicine

## 2023-05-11 ENCOUNTER — Other Ambulatory Visit: Payer: Self-pay

## 2023-05-11 DIAGNOSIS — J181 Lobar pneumonia, unspecified organism: Secondary | ICD-10-CM | POA: Diagnosis not present

## 2023-05-11 DIAGNOSIS — I10 Essential (primary) hypertension: Secondary | ICD-10-CM | POA: Insufficient documentation

## 2023-05-11 DIAGNOSIS — R918 Other nonspecific abnormal finding of lung field: Secondary | ICD-10-CM | POA: Diagnosis not present

## 2023-05-11 DIAGNOSIS — R0989 Other specified symptoms and signs involving the circulatory and respiratory systems: Secondary | ICD-10-CM | POA: Diagnosis not present

## 2023-05-11 DIAGNOSIS — R0602 Shortness of breath: Secondary | ICD-10-CM | POA: Diagnosis not present

## 2023-05-11 DIAGNOSIS — J45909 Unspecified asthma, uncomplicated: Secondary | ICD-10-CM | POA: Insufficient documentation

## 2023-05-11 DIAGNOSIS — R0789 Other chest pain: Secondary | ICD-10-CM | POA: Diagnosis not present

## 2023-05-11 DIAGNOSIS — J168 Pneumonia due to other specified infectious organisms: Secondary | ICD-10-CM | POA: Insufficient documentation

## 2023-05-11 DIAGNOSIS — J189 Pneumonia, unspecified organism: Secondary | ICD-10-CM

## 2023-05-11 DIAGNOSIS — Z7952 Long term (current) use of systemic steroids: Secondary | ICD-10-CM | POA: Insufficient documentation

## 2023-05-11 DIAGNOSIS — R059 Cough, unspecified: Secondary | ICD-10-CM | POA: Diagnosis not present

## 2023-05-11 LAB — RESP PANEL BY RT-PCR (RSV, FLU A&B, COVID)  RVPGX2
Influenza A by PCR: NEGATIVE
Influenza B by PCR: NEGATIVE
Resp Syncytial Virus by PCR: NEGATIVE
SARS Coronavirus 2 by RT PCR: NEGATIVE

## 2023-05-11 LAB — CBC
HCT: 40.1 % (ref 36.0–46.0)
Hemoglobin: 12.7 g/dL (ref 12.0–15.0)
MCH: 25.7 pg — ABNORMAL LOW (ref 26.0–34.0)
MCHC: 31.7 g/dL (ref 30.0–36.0)
MCV: 81 fL (ref 80.0–100.0)
Platelets: 258 10*3/uL (ref 150–400)
RBC: 4.95 MIL/uL (ref 3.87–5.11)
RDW: 14.4 % (ref 11.5–15.5)
WBC: 11 10*3/uL — ABNORMAL HIGH (ref 4.0–10.5)
nRBC: 0 % (ref 0.0–0.2)

## 2023-05-11 LAB — COMPREHENSIVE METABOLIC PANEL
ALT: 73 U/L — ABNORMAL HIGH (ref 0–44)
AST: 41 U/L (ref 15–41)
Albumin: 3.8 g/dL (ref 3.5–5.0)
Alkaline Phosphatase: 77 U/L (ref 38–126)
Anion gap: 10 (ref 5–15)
BUN: 14 mg/dL (ref 6–20)
CO2: 22 mmol/L (ref 22–32)
Calcium: 8.9 mg/dL (ref 8.9–10.3)
Chloride: 104 mmol/L (ref 98–111)
Creatinine, Ser: 0.48 mg/dL (ref 0.44–1.00)
GFR, Estimated: >60 mL/min (ref >60)
Glucose, Bld: 212 mg/dL — ABNORMAL HIGH (ref 70–99)
Potassium: 4.1 mmol/L (ref 3.5–5.1)
Sodium: 136 mmol/L (ref 135–145)
Total Bilirubin: 0.2 mg/dL (ref 0.0–1.2)
Total Protein: 7.4 g/dL (ref 6.5–8.1)

## 2023-05-11 LAB — D-DIMER, QUANTITATIVE: D-Dimer, Quant: <0.27 ug{FEU}/mL (ref 0.00–0.50)

## 2023-05-11 MED ORDER — ALBUTEROL SULFATE (2.5 MG/3ML) 0.083% IN NEBU
5.0000 mg | INHALATION_SOLUTION | Freq: Once | RESPIRATORY_TRACT | Status: AC
  Administered 2023-05-11: 5 mg via RESPIRATORY_TRACT
  Filled 2023-05-11: qty 6

## 2023-05-11 MED ORDER — AMOXICILLIN-POT CLAVULANATE 875-125 MG PO TABS: 1.0000 | ORAL_TABLET | Freq: Two times a day (BID) | ORAL | 0 refills | Status: DC

## 2023-05-11 MED ORDER — SODIUM CHLORIDE 0.9 % IV SOLN
1.0000 g | Freq: Once | INTRAVENOUS | Status: AC
  Administered 2023-05-11: 1 g via INTRAVENOUS
  Filled 2023-05-11: qty 10

## 2023-05-11 MED ORDER — AZITHROMYCIN 250 MG PO TABS: 250.0000 mg | ORAL_TABLET | Freq: Every day | ORAL | 0 refills | Status: DC

## 2023-05-11 MED ORDER — AZITHROMYCIN 250 MG PO TABS
500.0000 mg | ORAL_TABLET | Freq: Once | ORAL | Status: AC
  Administered 2023-05-11: 500 mg via ORAL
  Filled 2023-05-11: qty 2

## 2023-05-11 MED ORDER — IPRATROPIUM BROMIDE 0.02 % IN SOLN
0.5000 mg | Freq: Once | RESPIRATORY_TRACT | Status: AC
  Administered 2023-05-11: 0.5 mg via RESPIRATORY_TRACT
  Filled 2023-05-11: qty 2.5

## 2023-05-11 NOTE — Telephone Encounter (Signed)
 Chief Complaint: difficulty breathing Symptoms: difficulty breathing, wheezing, cough, bloody sputum, headache, body aches Frequency: 4 days Disposition: [x] ED /[] Urgent Care (no appt availability in office) / [] Appointment(In office/virtual)/ []  [place] [facility]/ [] Home Care/ [] Refused Recommended Disposition /[] [place] [facility]/ []  Follow-up with PCP Additional Notes: Pt reports flu-like symptoms with SOB and wheezing that is getting worse. No improvement with neb treatments. Hx of asthma. Pt seen at [facility] on 3/19 and was neg for the flu and COVID. Pt endorses SOB at rest while lying down, audibly SOB on the phone. Headache, body aches, fever, chest tightness, and cough with bloody sputum as well. RN advised pt needs to go to the ED. Pt states her husband wanted to take her but she wanted to call us first. RN told pt RN would call 911. Pt declined that and said her husband will take her immediately as they are 5 minutes from the hospital. RN advised pt EMS can start a breathing treatment en route, pt still declined EMS. RN advised pt if she gets worse before they get there they need to call 911. Pt verbalized understanding.   Copied from CRM [id number]. Topic: Clinical - Red Word Triage >> May 11, 2023 12:34 PM Ivette P wrote: Red Word that prompted transfer to Nurse Triage: Headache, fever, coughing, body aches. went to urgent care. still sick  feels like she is dying Reason for Disposition  [1] MODERATE difficulty breathing (e.g., speaks in phrases, SOB even at rest, pulse 100-120) AND [2] NEW-onset or WORSE than normal  Answer Assessment - Initial Assessment Questions 1. RESPIRATORY STATUS: "Describe your breathing?" (e.g., wheezing, shortness of breath, unable to speak, severe coughing)      Difficulty breathing, wheezing, "treatments not helping", SOB with lying down 2. ONSET: "When did this breathing problem begin?"      4 days of flu-like symptoms (headache, fever, cough, body  aches), seen at [facility] 3/19, COVID & Flu neg 3. PATTERN "Does the difficult breathing come and go, or has it been constant since it started?"      Worsening, SOB at rest 4. SEVERITY: "How bad is your breathing?" (e.g., mild, moderate, severe)    - MILD: No SOB at rest, mild SOB with walking, speaks normally in sentences, can lie down, no retractions, pulse < 100.    - MODERATE: SOB at rest, SOB with minimal exertion and prefers to sit, cannot lie down flat, speaks in phrases, mild retractions, audible wheezing, pulse 100-120.    - SEVERE: Very SOB at rest, speaks in single words, struggling to breathe, sitting hunched forward, retractions, pulse > 120      Moderate - pt clearly SOB on the phone w/ SOB at rest, labored breathing 5. RECURRENT SYMPTOM: "Have you had difficulty breathing before?" If Yes, ask: "When was the last time?" and "What happened that time?"      Yes hx of asthma 6. CARDIAC HISTORY: "Do you have any history of heart disease?" (e.g., heart attack, angina, bypass surgery, angioplasty)      No 7. LUNG HISTORY: "Do you have any history of lung disease?"  (e.g., pulmonary embolus, asthma, emphysema)     Asthma 8. CAUSE: "What do you think is causing the breathing problem?"      Flu-like symptoms, asthma  9. OTHER SYMPTOMS: "Do you have any other symptoms? (e.g., dizziness, runny nose, cough, chest pain, fever)     Coughing up bloody sputum, fever, headache, body aches, chest feels tight  Protocols used: Breathing  Difficulty-A-AH

## 2023-05-11 NOTE — Discharge Instructions (Signed)
 Continue your albuterol nebs, 1 treatment every 4-6 hours as needed.  Drink plenty of fluids.  Take Tylenol every 4 hours if needed for fever and/or bodyaches.  Start the antibiotics tomorrow.  Please follow-up with your primary care provider early next week for recheck, return to the emergency department if you develop any new or worsening symptoms.

## 2023-05-11 NOTE — ED Provider Notes (Signed)
 [facility]   CSN: [id number] Arrival date & time: 05/11/23  1304     History  Chief Complaint  Patient presents with   Shortness of Breath    [facility] is a 53 y.o. female.   Shortness of Breath Associated symptoms: chest pain, cough, fever and headaches   Associated symptoms: no abdominal pain, no neck pain, no rash, no sore throat and no vomiting         [facility] is a 53 y.o. female past medical history of hypertension, anemia and asthma who presents to the Emergency Department complaining of fever, generalized bodyaches, nausea, chest pain and shortness of breath.  Symptoms present for 4 days.  Cough has been productive of colored sputum and occasionally mixed with streaks of blood.  Describes having a sharp burning pain in her mid chest associated with coughing.  States her cough has been forceful at times.  Shortness of breath worsens when supine.  She has been using albuterol nebs at home without significant relief.  Subjective fever at home.  She was seen 05/09/2023 at urgent care.  States she had a negative COVID and flu test, given injection of steroids in the clinic and prescribed prednisone, cough medication to take at home.  She denies any improvement of her symptoms.   Home Medications Prior to Admission medications   Medication Sig Start Date End Date Taking? Authorizing Provider  albuterol (PROVENTIL) (2.5 MG/3ML) 0.083% nebulizer solution Take 3 mLs (2.5 mg total) by nebulization every 6 (six) hours as needed for wheezing or shortness of breath. 05/09/23   Leath-Warren, Sadie Haber, NP  albuterol (VENTOLIN HFA) 108 (90 Base) MCG/ACT inhaler Inhale 2 puffs into the lungs every 6 (six) hours as needed. 05/09/23   Leath-Warren, Sadie Haber, NP  cetirizine (ZYRTEC ALLERGY) 10 MG tablet Take 1 tablet (10 mg total) by mouth daily. 01/05/20   Avegno, Zachery Dakins, FNP  fluticasone (FLONASE) 50 MCG/ACT nasal  spray Place 2 sprays into both nostrils daily. 05/09/23   Leath-Warren, Sadie Haber, NP  furosemide (LASIX) 20 MG tablet Take 1/2 of a pill daily 10/11/18   Bethann Berkshire, MD  furosemide (LASIX) 40 MG tablet TAKE ONE TABLET (40MG ) BY MOUTH ONCE A DAY AS NEEDED 12/06/22   Gilmore Laroche, FNP  ibuprofen (ADVIL) 600 MG tablet Take 600 mg by mouth every 6 (six) hours as needed.    [provider]  lidocaine (XYLOCAINE) 2 % solution Use as directed 10 mLs in the mouth or throat every 3 (three) hours as needed for mouth pain. 08/19/22   Particia Nearing, PA-C  meloxicam (MOBIC) 7.5 MG tablet Take 1 tablet (7.5 mg total) by mouth 2 (two) times daily. 10/16/22   Vickki Hearing, MD  methylPREDNISolone (MEDROL DOSEPAK) 4 MG TBPK tablet Take as the package instructed 03/03/22   Gilmore Laroche, FNP  predniSONE (DELTASONE) 50 MG tablet Take 1 tablet daily with breakfast for the next 5 days. 05/09/23   Leath-Warren, Sadie Haber, NP  promethazine-dextromethorphan (PROMETHAZINE-DM) 6.25-15 MG/5ML syrup Take 5 mLs by mouth 4 (four) times daily as needed. 05/09/23   Leath-Warren, Sadie Haber, NP  Semaglutide-Weight Management 0.25 MG/0.5ML SOAJ Inject 0.25 mg into the skin once a week. 11/29/21         Allergies    Gabapentin    Review of Systems   Review of Systems  Constitutional:  Positive for chills and fever. Negative for appetite change.  HENT:  Positive for congestion. Negative for sore throat and trouble swallowing.   Respiratory:  Positive for cough, chest tightness and shortness of breath.   Cardiovascular:  Positive for chest pain.  Gastrointestinal:  Positive for nausea. Negative for abdominal pain, diarrhea and vomiting.  Genitourinary:  Negative for dysuria.  Musculoskeletal:  Positive for myalgias. Negative for arthralgias, neck pain and neck stiffness.  Skin:  Negative for rash.  Neurological:  Positive for headaches. Negative for dizziness, syncope, weakness, light-headedness and  numbness.    Physical Exam Updated Vital Signs BP (!) 117/97   Pulse 78   Temp 98.6 F (37 C) (Oral)   Resp 20   Ht 5' (1.524 m)   Wt 124 kg   SpO2 97%   BMI 53.39 kg/m  Physical Exam Vitals and nursing note reviewed.  Constitutional:      General: She is not in acute distress.    Appearance: She is well-developed. She is not toxic-appearing.  HENT:     Right Ear: Tympanic membrane and ear canal normal.     Left Ear: Tympanic membrane and ear canal normal.     Mouth/Throat:     Mouth: Mucous membranes are moist.     Pharynx: Oropharynx is clear. No oropharyngeal exudate or posterior oropharyngeal erythema.  Eyes:     Conjunctiva/sclera: Conjunctivae normal.     Pupils: Pupils are equal, round, and reactive to light.  Cardiovascular:     Rate and Rhythm: Normal rate and regular rhythm.     Pulses: Normal pulses.  Pulmonary:     Breath sounds: Wheezing present.     Comments: Occasional expiratory wheeze noted bilaterally.  Diminished lung sounds.  Mild respiratory distress with speaking Abdominal:     Palpations: Abdomen is soft.     Tenderness: There is no abdominal tenderness.  Musculoskeletal:     Cervical back: Normal range of motion. No rigidity.     Right lower leg: No edema.     Left lower leg: No edema.  Lymphadenopathy:     Cervical: No cervical adenopathy.  Skin:    General: Skin is warm.     Capillary Refill: Capillary refill takes less than 2 seconds.  Neurological:     General: No focal deficit present.     Mental Status: She is alert.     Sensory: No sensory deficit.     Motor: No weakness.     ED Results / Procedures / Treatments   Labs (all labs ordered are listed, but only abnormal results are displayed) Labs Reviewed  CBC - Abnormal; Notable for the following components:      Result Value   WBC 11.0 (*)    MCH 25.7 (*)    All other components within normal limits  COMPREHENSIVE METABOLIC PANEL - Abnormal; Notable for the following  components:   Glucose, Bld 212 (*)    ALT 73 (*)    All other components within normal limits  RESP PANEL BY RT-PCR (RSV, FLU A&B, COVID)  [id number]  D-DIMER, QUANTITATIVE    EKG EKG Interpretation Date/Time:  Friday May 11 2023 13:12:30 EDT Ventricular Rate:  88 PR Interval:  138 QRS Duration:  122 QT Interval:  406 QTC Calculation: 491 R Axis:   -6  Text Interpretation: Normal sinus rhythm Right bundle branch block Abnormal ECG When compared with ECG of 11-Oct-2018 09:46, No significant change since last tracing Confirmed by Meridee Score [id number]) on 05/11/2023 1:16:01 PM  Radiology DG Chest 2 View Result Date:  05/11/2023 CLINICAL DATA:  Cough and shortness of breath.  Fever. EXAM: CHEST - 2 VIEW COMPARISON:  10/11/2018 FINDINGS: Lung volumes are low. Prominent heart size. Moderate peribronchial thickening. Ill-defined opacity in both lung bases. No large pleural effusion or pneumothorax. No acute osseous findings. Soft tissue attenuation from habitus limits assessment. IMPRESSION: 1. Low lung volumes with peribronchial thickening. Ill-defined opacity in both lung bases may represent atelectasis or pneumonia. 2. Prominent heart size. Electronically Signed   By: Narda Rutherford M.D.   On: 05/11/2023 16:08    Procedures Procedures    Medications Ordered in ED Medications - No data to display  ED Course/ Medical Decision Making/ A&P                                 Medical Decision Making Pt here for URI sx's for few days.  Shortness of breath with cough and exertion.  Initially seen at [facility] and had neg flu and covid test.  Using albuterol nebs at home w/o relief  I suspect PNA, but viral process, PE, bronchitis also considered  Amount and/or Complexity of Data Reviewed Labs: ordered.    Details: Labs unremarkable Radiology: ordered.    Details: CXR shows ill defined opacity bilateral lung bases.  Discussion of management or test interpretation with external provider(s):  Patient ambulated in the department, symptomatic felt weak and dizzy with shortness of breath but no hypoxia with pulse ox.  Has drank fluids.  No clinical concern for sepsis.I have offered [facility] admission, but pt declined, states she prefers to try oral antibiotics at home. She has albuterol nebulizer at home, so I think trial of out patient treatment is reasonable, she agrees to prompt ER return if sx's worsen.    Risk Prescription drug management.           Final Clinical Impression(s) / ED Diagnoses Final diagnoses:  Pneumonia of both lower lobes due to infectious organism    Rx / DC Orders ED Discharge Orders     None         Rosey Bath 05/14/23 2236    Terrilee Files, MD 05/15/23 1450

## 2023-05-11 NOTE — ED Notes (Signed)
 Received report from [employer], pt resting nad. Receiving abx.

## 2023-05-11 NOTE — ED Triage Notes (Signed)
 Pt bib pov w/ c/o sob, productive cough (w/ blood) fever, body aches, and nausea. Pt is reporting some intermittent sharp chest pain and a burning pain when coughing

## 2023-05-12 NOTE — Telephone Encounter (Signed)
 [place] pt, kindly review telephone encounter on 08/03/2022

## 2023-05-14 NOTE — Telephone Encounter (Signed)
 Patient went to ER>

## 2023-05-15 ENCOUNTER — Telehealth: Payer: Self-pay | Admitting: Family Medicine

## 2023-05-15 NOTE — Telephone Encounter (Signed)
 FMLA  Noted  Copied Sleeved  Original in PCP box Copy front desk folder

## 2023-05-17 ENCOUNTER — Encounter: Payer: Self-pay | Admitting: Family Medicine

## 2023-05-17 ENCOUNTER — Telehealth: Payer: Self-pay | Admitting: Family Medicine

## 2023-05-17 ENCOUNTER — Ambulatory Visit (INDEPENDENT_AMBULATORY_CARE_PROVIDER_SITE_OTHER): Admitting: Family Medicine

## 2023-05-17 VITALS — BP 157/91 | HR 97 | Temp 98.1°F | Resp 19 | Ht 61.0 in | Wt 275.0 lb

## 2023-05-17 DIAGNOSIS — R058 Other specified cough: Secondary | ICD-10-CM | POA: Diagnosis not present

## 2023-05-17 DIAGNOSIS — J189 Pneumonia, unspecified organism: Secondary | ICD-10-CM

## 2023-05-17 DIAGNOSIS — R0602 Shortness of breath: Secondary | ICD-10-CM

## 2023-05-17 MED ORDER — ALBUTEROL SULFATE (2.5 MG/3ML) 0.083% IN NEBU: 2.5000 mg | INHALATION_SOLUTION | Freq: Four times a day (QID) | RESPIRATORY_TRACT | 1 refills | Status: AC | PRN

## 2023-05-17 MED ORDER — ALBUTEROL SULFATE HFA 108 (90 BASE) MCG/ACT IN AERS: 2.0000 | INHALATION_SPRAY | Freq: Four times a day (QID) | RESPIRATORY_TRACT | 0 refills | Status: AC | PRN

## 2023-05-17 MED ORDER — PROMETHAZINE-DM 6.25-15 MG/5ML PO SYRP: 5.0000 mL | ORAL_SOLUTION | Freq: Four times a day (QID) | ORAL | 0 refills | Status: DC | PRN

## 2023-05-17 NOTE — Patient Instructions (Addendum)
 I appreciate the opportunity to provide care to you today!   Pneumonia Complete the full course of antibiotics as prescribed. A prescription for Promethazine DM has been sent to your pharmacy to help with cough. Refills for your albuterol inhaler and albuterol nebulizer solution have also been sent to your pharmacy. Please follow up if your symptoms worsen, including increased shortness of breath, chest pain, high fever, or if you are not improving as expected.    Please continue to a heart-healthy diet and increase your physical activities. Try to exercise for at least five days a week.    It was a pleasure to see you and I look forward to continuing to work together on your health and well-being. Please do not hesitate to call the office if you need care or have questions about your care.  In case of emergency, please visit the Emergency Department for urgent care, or contact our clinic at [phone] to schedule an appointment. We're here to help you!   Have a wonderful day and week. With Gratitude, Gilmore Laroche MSN, FNP-BC

## 2023-05-17 NOTE — Progress Notes (Unsigned)
 Established Patient Office Visit  Subjective:  Patient ID: Melanie Boyd, female    DOB: 22-Jun-1970  Age: 53 y.o. MRN: [id number]  CC:  Chief Complaint  Patient presents with   Pneumonia    Has been treated for pneumonia, started getting sick on 3/17. Went to ER and was diagnosed with pneumonia. Still feels very tired and SOB. Ran out of nebulizer meds this am. When she does the neb she starts coughing, produces white mucus.     HPI Melanie Boyd is a 53 y.o. female with past medical history of obesity presents for  ED f/u.  For the details of today's visit, please refer to the assessment and plan.  Pneumonia of both lower lobes:The patient was seen in the emergency room on May 11, 2023, with complaints of a productive cough with colored sputum, fever, chest pain, and shortness of breath for approximately four days. A respiratory panel was negative for COVID-19 and influenza. Given her history of asthma, she received a steroid injection in the clinic and was prescribed oral prednisone, which provided minimal relief. An EKG showed normal sinus rhythm, and a chest X-ray revealed ill-defined opacities at the bilateral lung bases, indicative of pneumonia. She was treated with azithromycin and Augmentin. The patient reports completing the azithromycin and notes she has a few tablets of Augmentin remaining. She currently complains of shortness of breath with exertion, persistent cough, and fatigue but denies fever or chills. Today, she is requesting a refill of her nebulizer treatment.     Past Medical History:  Diagnosis Date   Anemia    Asthma    Hypertension    Shortness of breath     Past Surgical History:  Procedure Laterality Date   COLONOSCOPY N/A 07/11/2012   Procedure: COLONOSCOPY;  Surgeon: Malissa Hippo, MD;  Location: [facility];  Service: Endoscopy;  Laterality: N/A;  730   MASS EXCISION Right 06/07/2016   Procedure: EXCISION 1.5 CM MASS RIGHT THIGH;  Surgeon: Franky Macho, MD;  Location: [facility];  Service: General;  Laterality: Right;   TUBAL LIGATION      Family History  Problem Relation Age of Onset   Healthy Mother    Healthy Father     Social History   Socioeconomic History   Marital status: Married    Spouse name: Not on file   Number of children: Not on file   Years of education: Not on file   Highest education level: Not on file  Occupational History   Not on file  Tobacco Use   Smoking status: Never   Smokeless tobacco: Never  Vaping Use   Vaping status: Never Used  Substance and Sexual Activity   Alcohol use: No   Drug use: No   Sexual activity: Yes    Birth control/protection: Surgical  Other Topics Concern   Not on file  Social History Narrative   Not on file   Social Drivers of Health   Financial Resource Strain: Not on file  Food Insecurity: Not on file  Transportation Needs: Not on file  Physical Activity: Not on file  Stress: Not on file  Social Connections: Not on file  Intimate Partner Violence: Not on file    Outpatient Medications Prior to Visit  Medication Sig Dispense Refill   amoxicillin-clavulanate (AUGMENTIN) 875-125 MG tablet Take 1 tablet by mouth every 12 (twelve) hours. 14 tablet 0   azithromycin (ZITHROMAX) 250 MG tablet Take 1 tablet (250 mg total) by mouth daily.  Take first 2 tablets together, then 1 every day until finished. 6 tablet 0   cetirizine (ZYRTEC ALLERGY) 10 MG tablet Take 1 tablet (10 mg total) by mouth daily. 30 tablet 0   fluticasone (FLONASE) 50 MCG/ACT nasal spray Place 2 sprays into both nostrils daily. 16 g 0   furosemide (LASIX) 20 MG tablet Take 1/2 of a pill daily 30 tablet 0   furosemide (LASIX) 40 MG tablet TAKE ONE TABLET (40MG ) BY MOUTH ONCE A DAY AS NEEDED 60 tablet 0   ibuprofen (ADVIL) 600 MG tablet Take 600 mg by mouth every 6 (six) hours as needed.     lidocaine (XYLOCAINE) 2 % solution Use as directed 10 mLs in the mouth or throat every 3 (three) hours as needed  for mouth pain. 100 mL 0   meloxicam (MOBIC) 7.5 MG tablet Take 1 tablet (7.5 mg total) by mouth 2 (two) times daily. 60 tablet 5   methylPREDNISolone (MEDROL DOSEPAK) 4 MG TBPK tablet Take as the package instructed 1 each 0   albuterol (PROVENTIL) (2.5 MG/3ML) 0.083% nebulizer solution Take 3 mLs (2.5 mg total) by nebulization every 6 (six) hours as needed for wheezing or shortness of breath. 75 mL 0   albuterol (VENTOLIN HFA) 108 (90 Base) MCG/ACT inhaler Inhale 2 puffs into the lungs every 6 (six) hours as needed. 8 g 0   Semaglutide-Weight Management 0.25 MG/0.5ML SOAJ Inject 0.25 mg into the skin once a week. 2 mL 2   predniSONE (DELTASONE) 50 MG tablet Take 1 tablet daily with breakfast for the next 5 days. (Patient not taking: Reported on 05/17/2023) 5 tablet 0   promethazine-dextromethorphan (PROMETHAZINE-DM) 6.25-15 MG/5ML syrup Take 5 mLs by mouth 4 (four) times daily as needed. (Patient not taking: Reported on 05/17/2023) 118 mL 0   No facility-administered medications prior to visit.    Allergies  Allergen Reactions   Gabapentin     Vision loss, memory loss    ROS Review of Systems  Constitutional:  Positive for fatigue. Negative for chills and fever.  Eyes:  Negative for visual disturbance.  Respiratory:  Positive for cough and shortness of breath. Negative for chest tightness.   Neurological:  Negative for dizziness and headaches.      Objective:    Physical Exam HENT:     Head: Normocephalic.     Mouth/Throat:     Mouth: Mucous membranes are moist.  Cardiovascular:     Rate and Rhythm: Normal rate.     Heart sounds: Normal heart sounds.  Pulmonary:     Effort: Pulmonary effort is normal.     Breath sounds: Wheezing present.  Neurological:     Mental Status: She is alert.     BP (!) 157/91   Pulse 97   Temp 98.1 F (36.7 C) (Oral)   Resp 19   Ht 5\' 1"  (1.549 m)   Wt 275 lb (124.7 kg)   SpO2 96%   BMI 51.96 kg/m  Wt Readings from Last 3 Encounters:   05/17/23 275 lb (124.7 kg)  05/11/23 273 lb 5.9 oz (124 kg)  11/20/22 274 lb (124.3 kg)    No results found for: "TSH" Lab Results  Component Value Date   WBC 11.0 (H) 05/11/2023   HGB 12.7 05/11/2023   HCT 40.1 05/11/2023   MCV 81.0 05/11/2023   PLT 258 05/11/2023   Lab Results  Component Value Date   NA 136 05/11/2023   K 4.1 05/11/2023   CO2 22  05/11/2023   GLUCOSE 212 (H) 05/11/2023   BUN 14 05/11/2023   CREATININE 0.48 05/11/2023   BILITOT 0.2 05/11/2023   ALKPHOS 77 05/11/2023   AST 41 05/11/2023   ALT 73 (H) 05/11/2023   PROT 7.4 05/11/2023   ALBUMIN 3.8 05/11/2023   CALCIUM 8.9 05/11/2023   ANIONGAP 10 05/11/2023   No results found for: "CHOL" No results found for: "HDL" No results found for: "LDLCALC" No results found for: "TRIG" No results found for: "CHOLHDL" No results found for: "HGBA1C"    Assessment & Plan:  Pneumonia of both lower lobes due to infectious organism Assessment & Plan: The patient was encouraged to complete the full course of antibiotics as prescribed. A prescription for Promethazine DM was sent to her pharmacy to help manage her cough. Refills for the patient's albuterol inhaler and albuterol nebulizer solution were also sent to the pharmacy. She was advised to follow up if her symptoms worsen, including increased shortness of breath, chest pain, high fever, or if she is not improving as expected.   Orders: -     DG Chest 2 View  SOB (shortness of breath) Assessment & Plan: Refills sent  Orders: -     Albuterol Sulfate; Take 3 mLs (2.5 mg total) by nebulization every 6 (six) hours as needed for wheezing or shortness of breath.  Dispense: 75 mL; Refill: 1 -     Albuterol Sulfate HFA; Inhale 2 puffs into the lungs every 6 (six) hours as needed.  Dispense: 8 g; Refill: 0  Other cough -     Promethazine-DM; Take 5 mLs by mouth 4 (four) times daily as needed.  Dispense: 118 mL; Refill: 0   Note: This chart has been completed using  Engineer, civil (consulting) software, and while attempts have been made to ensure accuracy, certain words and phrases may not be transcribed as intended.  Follow-up: No follow-ups on file.   Gilmore Laroche, FNP

## 2023-05-17 NOTE — Telephone Encounter (Signed)
 Patient said going forward will see Darral Dash. Patient said will call back with an appointment.

## 2023-05-17 NOTE — Telephone Encounter (Signed)
 Per patient will continue care with Gilmore Laroche.

## 2023-05-17 NOTE — Telephone Encounter (Signed)
 Per patient she would like to continue care with Gilmore Laroche

## 2023-05-17 NOTE — Telephone Encounter (Signed)
 updated

## 2023-05-17 NOTE — Telephone Encounter (Signed)
 Patients are allowed one switch. I will see the patient today, but I would prefer that she follow up with the provider she requested to switch to.

## 2023-05-18 DIAGNOSIS — J189 Pneumonia, unspecified organism: Secondary | ICD-10-CM | POA: Insufficient documentation

## 2023-05-18 DIAGNOSIS — R0602 Shortness of breath: Secondary | ICD-10-CM | POA: Insufficient documentation

## 2023-05-18 NOTE — Assessment & Plan Note (Signed)
 Refills sent

## 2023-05-18 NOTE — Telephone Encounter (Signed)
 Patient called in to check status on FMLA forms

## 2023-05-18 NOTE — Assessment & Plan Note (Signed)
 The patient was encouraged to complete the full course of antibiotics as prescribed. A prescription for Promethazine DM was sent to her pharmacy to help manage her cough. Refills for the patient's albuterol inhaler and albuterol nebulizer solution were also sent to the pharmacy. She was advised to follow up if her symptoms worsen, including increased shortness of breath, chest pain, high fever, or if she is not improving as expected.

## 2023-05-21 NOTE — Telephone Encounter (Deleted)
 Copied from CRM [id number]. Topic: General - Other >> May 21, 2023  1:03 PM Marland Kitchen D wrote: Been out sick says doctor told her to go back May 22, 2023 and she needs a form for work it's a point system. Been out since May 09, 2023. Call back today please [phone]

## 2023-05-21 NOTE — Telephone Encounter (Signed)
 Patient called in regard to FMLA forms Patient is needing forms completed to keep employment  Patient was scheduled to return to work April 1 but cannot without forms completed  Patient wants a return call in regard to forms

## 2023-05-23 ENCOUNTER — Ambulatory Visit (INDEPENDENT_AMBULATORY_CARE_PROVIDER_SITE_OTHER)

## 2023-05-23 ENCOUNTER — Telehealth: Payer: Self-pay | Admitting: Family Medicine

## 2023-05-23 ENCOUNTER — Other Ambulatory Visit (HOSPITAL_COMMUNITY): Payer: Self-pay | Admitting: Family Medicine

## 2023-05-23 VITALS — BP 139/81 | HR 94 | Ht 61.0 in | Wt 273.1 lb

## 2023-05-23 DIAGNOSIS — J189 Pneumonia, unspecified organism: Secondary | ICD-10-CM | POA: Diagnosis not present

## 2023-05-23 DIAGNOSIS — Z1231 Encounter for screening mammogram for malignant neoplasm of breast: Secondary | ICD-10-CM

## 2023-05-23 NOTE — Progress Notes (Signed)
   Established Patient Office Visit  Subjective   Patient ID: Melanie Boyd, female    DOB: 1970/04/19  Age: 53 y.o. MRN: [id number]  Chief Complaint  Patient presents with   Pneumonia    Pt states she "been out since march 19th from work with pneumonia, and is feeling better but not great, Still have a cough, and a little congestion, and says she still feeling a sensation in her lungs but not bad"    HPI  Past Medical History:  Diagnosis Date   Anemia    Asthma    Hypertension    Shortness of breath       Review of Systems  Constitutional:  Positive for malaise/fatigue (some mild fatigue, but improving overall). Negative for chills, diaphoresis and fever.  HENT: Negative.    Respiratory:  Positive for cough (mild, lingering dry cough).   Cardiovascular: Negative.   Neurological: Negative.   Psychiatric/Behavioral: Negative.        Objective:     BP 139/81 (BP Location: Left Arm, Patient Position: Sitting, Cuff Size: Large)   Pulse 94   Ht 5\' 1"  (1.549 m)   Wt 273 lb 1.9 oz (123.9 kg)   SpO2 98%   BMI 51.61 kg/m  SpO2 Readings from Last 3 Encounters:  05/23/23 98%  05/17/23 96%  05/11/23 99%      Physical Exam Vitals and nursing note reviewed.  Constitutional:      Appearance: She is obese.  HENT:     Head: Normocephalic.  Cardiovascular:     Rate and Rhythm: Normal rate and regular rhythm.  Pulmonary:     Effort: Pulmonary effort is normal.     Breath sounds: Normal breath sounds.  Neurological:     Mental Status: She is oriented to person, place, and time.  Psychiatric:        Mood and Affect: Mood normal.        Thought Content: Thought content normal.      No results found for any visits on 05/23/23.    The ASCVD Risk score (Arnett DK, et al., 2019) failed to calculate for the following reasons:   Cannot find a previous HDL lab   Cannot find a previous total cholesterol lab    Assessment & Plan:   Problem List Items Addressed This  Visit       Respiratory   Pneumonia of both lower lobes due to infectious organism - Primary   Recent ED visit reviewed.  Pt reports feeling better. Unremarkable exam in office today. She has been cleared to return to work.           No follow-ups on file.    Darral Dash, FNP

## 2023-05-23 NOTE — Assessment & Plan Note (Addendum)
 Recent ED visit reviewed.  Pt reports feeling better. Unremarkable exam in office today. She has been cleared to return to work.

## 2023-05-23 NOTE — Telephone Encounter (Signed)
 completed

## 2023-05-23 NOTE — Telephone Encounter (Signed)
 [employer] FMLA forms  Noted Copied Sleeved (put in provider box)

## 2023-05-24 NOTE — Telephone Encounter (Signed)
 Patient picked up forms  Forms sent to [employer]

## 2023-05-24 NOTE — Telephone Encounter (Signed)
 Spoke with patient- will be by in a few to pick up

## 2023-05-24 NOTE — Telephone Encounter (Signed)
 Forms completed and faxed to [phone] Copy made and sent to [employer] patient for pick up

## 2023-06-11 ENCOUNTER — Ambulatory Visit (HOSPITAL_COMMUNITY)
Admission: RE | Admit: 2023-06-11 | Discharge: 2023-06-11 | Disposition: A | Discharge status: Home / Self Care | Source: Ambulatory Visit | Attending: Family Medicine | Admitting: Family Medicine

## 2023-06-11 DIAGNOSIS — Z1231 Encounter for screening mammogram for malignant neoplasm of breast: Secondary | ICD-10-CM | POA: Insufficient documentation

## 2023-08-16 ENCOUNTER — Other Ambulatory Visit (HOSPITAL_COMMUNITY)
Admission: RE | Admit: 2023-08-16 | Discharge: 2023-08-16 | Disposition: A | Discharge status: Home / Self Care | Source: Ambulatory Visit | Attending: Family Medicine | Admitting: Family Medicine

## 2023-08-16 ENCOUNTER — Other Ambulatory Visit (HOSPITAL_COMMUNITY): Payer: Self-pay

## 2023-08-16 ENCOUNTER — Telehealth: Payer: Self-pay

## 2023-08-16 ENCOUNTER — Encounter: Payer: Self-pay | Admitting: Family Medicine

## 2023-08-16 ENCOUNTER — Ambulatory Visit (INDEPENDENT_AMBULATORY_CARE_PROVIDER_SITE_OTHER): Admitting: Family Medicine

## 2023-08-16 VITALS — BP 144/84 | HR 75 | Resp 16 | Ht 61.0 in | Wt 270.0 lb

## 2023-08-16 DIAGNOSIS — E876 Hypokalemia: Secondary | ICD-10-CM | POA: Diagnosis not present

## 2023-08-16 DIAGNOSIS — Z124 Encounter for screening for malignant neoplasm of cervix: Secondary | ICD-10-CM

## 2023-08-16 DIAGNOSIS — E038 Other specified hypothyroidism: Secondary | ICD-10-CM

## 2023-08-16 DIAGNOSIS — E559 Vitamin D deficiency, unspecified: Secondary | ICD-10-CM | POA: Diagnosis not present

## 2023-08-16 DIAGNOSIS — R7301 Impaired fasting glucose: Secondary | ICD-10-CM | POA: Diagnosis not present

## 2023-08-16 DIAGNOSIS — R600 Localized edema: Secondary | ICD-10-CM | POA: Diagnosis not present

## 2023-08-16 DIAGNOSIS — Z0001 Encounter for general adult medical examination with abnormal findings: Secondary | ICD-10-CM

## 2023-08-16 DIAGNOSIS — E7849 Other hyperlipidemia: Secondary | ICD-10-CM | POA: Diagnosis not present

## 2023-08-16 DIAGNOSIS — Z1159 Encounter for screening for other viral diseases: Secondary | ICD-10-CM

## 2023-08-16 MED ORDER — FUROSEMIDE 20 MG PO TABS
10.0000 mg | ORAL_TABLET | Freq: Every day | ORAL | 0 refills | Status: AC
  Filled 2023-08-16: qty 90, fill #0

## 2023-08-16 MED ORDER — FUROSEMIDE 20 MG PO TABS: 20.0000 mg | ORAL_TABLET | Freq: Two times a day (BID) | ORAL | 0 refills | Status: DC | PRN

## 2023-08-16 MED ORDER — POTASSIUM CHLORIDE CRYS ER 10 MEQ PO TBCR
10.0000 meq | EXTENDED_RELEASE_TABLET | ORAL | 1 refills | Status: AC | PRN
  Filled 2023-08-16: qty 90, fill #0

## 2023-08-16 NOTE — Telephone Encounter (Signed)
 Copied from CRM [id number]. Topic: Clinical - Medication Question >> Aug 16, 2023  3:58 PM Delon HERO wrote: Reason for CRM: Pharmacy is calling to report received script for Rx #: [id number]  potassium chloride (KLOR-CON M) 10 MEQ tablet [[id number]] . Reporting only fill scripts for patients in the hospital. May want to reach out to the patient to ask where she would like to be sent. Since they are unable to fill the script.

## 2023-08-16 NOTE — Patient Instructions (Addendum)
 I appreciate the opportunity to provide care to you today!    Follow up:  4 months  Labs: please stop by the lab during the week to get your blood drawn (CBC, CMP, TSH, Lipid profile, HgA1c, Vit D)   Lower Extremities Edema Start Furosemide  20 mg twice daily as needed for lower extremity edema.  Take Potassium Chloride 10 mEq daily with Furosemide  to help counteract potential hypokalemia (low potassium levels).  I also recommend increasing your intake of potassium-rich foods such as: Bananas Oranges Spinach Sweet potatoes Avocados  Lifestyle Recommendations: Elevate legs daily for 20-30 minutes above heart level to reduce swelling. Use compression stockings daily. Engage in regular physical activity and work toward weight reduction, as tolerated. Avoid prolonged sitting or standing to minimize fluid buildup in the legs.    For a Healthier YOU, I Recommend: Reducing your intake of sugar, sodium, carbohydrates, and saturated fats. Increasing your fiber intake by incorporating more whole grains, fruits, and vegetables into your meals. Setting healthy goals with a focus on lowering your consumption of carbs, sugar, and unhealthy fats. Adding variety to your diet by including a wide range of fruits and vegetables. Cutting back on soda and limiting processed foods as much as possible. Staying active: In addition to taking your weight loss medication, aim for at least 150 minutes of moderate-intensity physical activity each week for optimal results.  Referrals today- Provider Referral Exercise Program at the [facility]    Please continue to a heart-healthy diet and increase your physical activities. Try to exercise for at least five days a week.    It was a pleasure to see you and I look forward to continuing to work together on your health and well-being. Please do not hesitate to call the office if you need care or have questions about your care.  In case of emergency, please  visit the Emergency Department for urgent care, or contact our clinic at [phone] to schedule an appointment. We're here to help you!   Have a wonderful day and week. With Gratitude, Aul Mangieri MSN, FNP-BC

## 2023-08-16 NOTE — Progress Notes (Signed)
 Complete physical exam  Patient: Melanie Boyd   DOB: December 20, 1970   53 y.o. Female  MRN: [id number]  Subjective:    Chief Complaint  Patient presents with   Annual Exam    Melanie Boyd is a 53 y.o. female who presents today for a complete physical exam. She reports consuming a general diet. The patient does not participate in regular exercise at present. She generally feels well. She reports sleeping well. She does not have additional problems to discuss today.    Most recent fall risk assessment:    08/16/2023    3:42 PM  Fall Risk   Falls in the past year? 0  Number falls in past yr: 0  Injury with Fall? 0  Follow up Falls evaluation completed     Most recent depression screenings:    08/16/2023    3:42 PM 05/23/2023    8:59 AM  PHQ 2/9 Scores  PHQ - 2 Score 0 0  PHQ- 9 Score  0    Vision:Not within last year   Patient Active Problem List   Diagnosis Date Noted   Encounter for general adult medical examination with abnormal findings 08/17/2023   Cervical cancer screening 08/17/2023   Bilateral lower extremity edema 08/17/2023   Pneumonia of both lower lobes due to infectious organism 05/18/2023   SOB (shortness of breath) 05/18/2023   Morbid obesity (HCC) 03/03/2022   Asthma, cold induced, mild intermittent, with acute exacerbation 03/03/2022   Bilateral knee pain 03/03/2022   Sebaceous cyst    Abdominal pain 05/30/2012   Nausea with vomiting 05/30/2012   Bronchitis, acute 12/05/2010   Asthma attack 12/03/2010   Anemia 12/03/2010   Hyperglycemia 12/03/2010   Past Medical History:  Diagnosis Date   Anemia    Asthma    Hypertension    Shortness of breath    Past Surgical History:  Procedure Laterality Date   COLONOSCOPY N/A 07/11/2012   Procedure: COLONOSCOPY;  Surgeon: Claudis RAYMOND Rivet, MD;  Location: [facility];  Service: Endoscopy;  Laterality: N/A;  730   MASS EXCISION Right 06/07/2016   Procedure: EXCISION 1.5 CM MASS RIGHT THIGH;  Surgeon:  Oneil Budge, MD;  Location: [facility];  Service: General;  Laterality: Right;   TUBAL LIGATION     Social History   Tobacco Use   Smoking status: Never   Smokeless tobacco: Never  Vaping Use   Vaping status: Never Used  Substance Use Topics   Alcohol use: No   Drug use: No   Social History   Socioeconomic History   Marital status: Married    Spouse name: Not on file   Number of children: Not on file   Years of education: Not on file   Highest education level: Not on file  Occupational History   Not on file  Tobacco Use   Smoking status: Never   Smokeless tobacco: Never  Vaping Use   Vaping status: Never Used  Substance and Sexual Activity   Alcohol use: No   Drug use: No   Sexual activity: Yes    Birth control/protection: Surgical  Other Topics Concern   Not on file  Social History Narrative   Not on file   Social Drivers of Health   Financial Resource Strain: Not on file  Food Insecurity: Not on file  Transportation Needs: Not on file  Physical Activity: Not on file  Stress: Not on file  Social Connections: Not on file  Intimate Partner Violence: Not on  file   Family Status  Relation Name Status   Sister 2 Alive       good health   Mother  Alive       hypertension   Sister 3 Alive       good hea;tj   Father  Alive       good health  No partnership data on file   Family History  Problem Relation Age of Onset   Healthy Mother    Healthy Father    Allergies  Allergen Reactions   Gabapentin     Vision loss, memory loss      Patient Care Team: Amal Saiki, FNP as PCP - General (Family Medicine)   Outpatient Medications Prior to Visit  Medication Sig   albuterol  (PROVENTIL ) (2.5 MG/3ML) 0.083% nebulizer solution Take 3 mLs (2.5 mg total) by nebulization every 6 (six) hours as needed for wheezing or shortness of breath.   albuterol  (VENTOLIN  HFA) 108 (90 Base) MCG/ACT inhaler Inhale 2 puffs into the lungs every 6 (six) hours as needed.    cetirizine  (ZYRTEC  ALLERGY) 10 MG tablet Take 1 tablet (10 mg total) by mouth daily.   fluticasone  (FLONASE ) 50 MCG/ACT nasal spray Place 2 sprays into both nostrils daily.   ibuprofen (ADVIL) 600 MG tablet Take 600 mg by mouth every 6 (six) hours as needed.   meloxicam  (MOBIC ) 7.5 MG tablet Take 1 tablet (7.5 mg total) by mouth 2 (two) times daily.   predniSONE  (DELTASONE ) 50 MG tablet Take 1 tablet daily with breakfast for the next 5 days.   [DISCONTINUED] furosemide  (LASIX ) 20 MG tablet Take 1/2 of a pill daily   [DISCONTINUED] amoxicillin -clavulanate (AUGMENTIN ) 875-125 MG tablet Take 1 tablet by mouth every 12 (twelve) hours. (Patient not taking: Reported on 05/23/2023)   [DISCONTINUED] azithromycin  (ZITHROMAX ) 250 MG tablet Take 1 tablet (250 mg total) by mouth daily. Take first 2 tablets together, then 1 every day until finished.   [DISCONTINUED] furosemide  (LASIX ) 40 MG tablet TAKE ONE TABLET (40MG ) BY MOUTH ONCE A DAY AS NEEDED (Patient not taking: Reported on 08/16/2023)   [DISCONTINUED] lidocaine  (XYLOCAINE ) 2 % solution Use as directed 10 mLs in the mouth or throat every 3 (three) hours as needed for mouth pain.   [DISCONTINUED] methylPREDNISolone  (MEDROL  DOSEPAK) 4 MG TBPK tablet Take as the package instructed   [DISCONTINUED] promethazine -dextromethorphan  (PROMETHAZINE -DM) 6.25-15 MG/5ML syrup Take 5 mLs by mouth 4 (four) times daily as needed.   No facility-administered medications prior to visit.    Review of Systems  Constitutional:  Negative for chills, fever and malaise/fatigue.  HENT:  Negative for congestion and sinus pain.   Eyes:  Negative for pain, discharge and redness.  Respiratory:  Negative for cough, sputum production and shortness of breath.   Cardiovascular:  Negative for chest pain, palpitations, claudication and leg swelling.  Gastrointestinal:  Negative for diarrhea, heartburn and nausea.  Genitourinary:  Negative for flank pain and frequency.  Musculoskeletal:   Negative for back pain and joint pain.  Skin:  Negative for itching.  Neurological:  Negative for dizziness, seizures and headaches.  Endo/Heme/Allergies:  Negative for environmental allergies.  Psychiatric/Behavioral:  Negative for memory loss. The patient does not have insomnia.        Objective:    BP (!) 144/84   Pulse 75   Resp 16   Ht 5' 1 (1.549 m)   Wt 270 lb (122.5 kg)   SpO2 95%   BMI 51.02 kg/m  BP Readings from  Last 3 Encounters:  08/16/23 (!) 144/84  05/23/23 139/81  05/17/23 (!) 157/91   Wt Readings from Last 3 Encounters:  08/16/23 270 lb (122.5 kg)  05/23/23 273 lb 1.9 oz (123.9 kg)  05/17/23 275 lb (124.7 kg)      Physical Exam HENT:     Head: Normocephalic.     Left Ear: External ear normal.     Mouth/Throat:     Mouth: Mucous membranes are moist.   Eyes:     Extraocular Movements: Extraocular movements intact.     Pupils: Pupils are equal, round, and reactive to light.    Cardiovascular:     Rate and Rhythm: Normal rate and regular rhythm.     Heart sounds: No murmur heard. Pulmonary:     Effort: Pulmonary effort is normal.     Breath sounds: Normal breath sounds.  Abdominal:     Palpations: Abdomen is soft.     Tenderness: There is no right CVA tenderness or left CVA tenderness.  Genitourinary:    General: Normal vulva.     Comments: Vaginal wall: pink and rugated, smooth and non-tender; absence of lesions, edema, and erythema. Labia Majora and Minora: present bilaterally, moist, soft tissue, and homogeneous; free of edema and ulcerations. Clitoris is anatomically present, above the urethral, and free of lesions, masses, and ulceration.    Musculoskeletal:     Right lower leg: No edema.     Left lower leg: No edema.   Skin:    Findings: No lesion or rash.   Neurological:     Mental Status: She is alert and oriented to person, place, and time.     GCS: GCS eye subscore is 4. GCS verbal subscore is 5. GCS motor subscore is 6.      Cranial Nerves: No facial asymmetry.     Sensory: No sensory deficit.     Motor: No weakness.     Coordination: Coordination normal. Finger-Nose-Finger Test normal.     Gait: Gait normal.   Psychiatric:        Judgment: Judgment normal.     No results found for any visits on 08/16/23. Last CBC Lab Results  Component Value Date   WBC 11.0 (H) 05/11/2023   HGB 12.7 05/11/2023   HCT 40.1 05/11/2023   MCV 81.0 05/11/2023   MCH 25.7 (L) 05/11/2023   RDW 14.4 05/11/2023   PLT 258 05/11/2023   Last metabolic panel Lab Results  Component Value Date   GLUCOSE 212 (H) 05/11/2023   NA 136 05/11/2023   K 4.1 05/11/2023   CL 104 05/11/2023   CO2 22 05/11/2023   BUN 14 05/11/2023   CREATININE 0.48 05/11/2023   GFRNONAA >60 05/11/2023   CALCIUM 8.9 05/11/2023   PROT 7.4 05/11/2023   ALBUMIN 3.8 05/11/2023   BILITOT 0.2 05/11/2023   ALKPHOS 77 05/11/2023   AST 41 05/11/2023   ALT 73 (H) 05/11/2023   ANIONGAP 10 05/11/2023   Last lipids No results found for: CHOL, HDL, LDLCALC, LDLDIRECT, TRIG, CHOLHDL Last hemoglobin A1c No results found for: HGBA1C Last thyroid  functions No results found for: TSH, T3TOTAL, T4TOTAL, THYROIDAB Last vitamin D No results found for: 25OHVITD2, 25OHVITD3, VD25OH Last vitamin B12 and Folate No results found for: VITAMINB12, FOLATE      Assessment & Plan:    Routine Health Maintenance and Physical Exam  Immunization History  Administered Date(s) Administered   Fluad Quad(high Dose 65+) 01/30/2022    Health Maintenance  Topic Date  Due   HIV Screening  Never done   Hepatitis C Screening  Never done   DTaP/Tdap/Td (1 - Tdap) Never done   Pneumococcal Vaccine 47-27 Years old (1 of 2 - PCV) Never done   Hepatitis B Vaccines (1 of 3 - 19+ 3-dose series) Never done   Cervical Cancer Screening (HPV/Pap Cotest)  Never done   Zoster Vaccines- Shingrix (1 of 2) Never done   Colonoscopy  07/12/2022   COVID-19  Vaccine (1 - 2024-25 season) Never done   INFLUENZA VACCINE  09/21/2023   MAMMOGRAM  06/10/2025   HPV VACCINES  Aged Out   Meningococcal B Vaccine  Aged Out    Discussed health benefits of physical activity, and encouraged her to engage in regular exercise appropriate for her age and condition.  Encounter for general adult medical examination with abnormal findings Assessment & Plan: Physical exam as documented Discussed heart-healthy diet  Encouraged to Exercise: If you are able: 30 -60 minutes a day ,4 days a week, or 150 minutes a week. The longer the better. Combine stretch, strength, and aerobic activities Encourage to eat whole Food, Plant Predominant Nutrition is highly recommended: Eat Plenty of vegetables, Mushrooms, fruits, Legumes, Whole Grains, Nuts, seeds in lieu of processed meats, processed snacks/pastries red meat, poultry, eggs.  Will f/u in 1 year for CPE      Bilateral lower extremity edema Assessment & Plan: Start Furosemide  20 mg BID as needed for lower extremity edema. Take Potassium Chloride  10 mEq daily with Furosemide  to prevent low potassium. Increase intake of potassium-rich foods (e.g., bananas, oranges, spinach).  Elevate legs daily, use compression stockings, stay active, work toward weight loss, and avoid prolonged sitting or standing.  Orders: -     Furosemide ; Take 0.5 tablets (10 mg total) by mouth daily.  Dispense: 90 tablet; Refill: 0  Hypokalemia -     Potassium Chloride  Crys ER; Take 1 tablet (10 mEq total) by mouth as needed.  Dispense: 90 tablet; Refill: 1  Cervical cancer screening Assessment & Plan: -Cytology and HPV co-testing (preferred) every 5 years or cytology alone (acceptable) every 3 years. - Pap due 2030   Orders: -     Cytology - PAP  Morbid obesity (HCC) Assessment & Plan: The patient expresses a desire to lose weight. She reports that she has been following a heart-healthy diet and would now like to increase her level  of physical activity.  A referral will be placed to the PREP program at the [facility] to support her fitness goals.  The patient was encouraged to engage in moderate-intensity exercise at least five days per week, totaling 150 minutes weekly, in combination with continued adherence to a heart-healthy diet.   Orders: -     Amb Referral To Provider Referral Exercise Program (P.R.E.P)  IFG (impaired fasting glucose) -     Hemoglobin A1c  Vitamin D deficiency -     VITAMIN D 25 Hydroxy (Vit-D Deficiency, Fractures)  Need for hepatitis C screening test -     Hepatitis C antibody  TSH (thyroid -stimulating hormone deficiency) -     TSH + free T4  Other hyperlipidemia -     Lipid panel -     CMP14+EGFR -     CBC with Differential/Platelet    No follow-ups on file.   Note: This chart has been completed using Engineer, civil (consulting) software, and while attempts have been made to ensure accuracy, certain words and phrases may not be transcribed as intended.  Lam Bjorklund, FNP

## 2023-08-17 DIAGNOSIS — Z0001 Encounter for general adult medical examination with abnormal findings: Secondary | ICD-10-CM | POA: Insufficient documentation

## 2023-08-17 DIAGNOSIS — R600 Localized edema: Secondary | ICD-10-CM | POA: Insufficient documentation

## 2023-08-17 DIAGNOSIS — Z124 Encounter for screening for malignant neoplasm of cervix: Secondary | ICD-10-CM | POA: Insufficient documentation

## 2023-08-17 NOTE — Assessment & Plan Note (Signed)
 Start Furosemide  20 mg BID as needed for lower extremity edema. Take Potassium Chloride  10 mEq daily with Furosemide  to prevent low potassium. Increase intake of potassium-rich foods (e.g., bananas, oranges, spinach).  Elevate legs daily, use compression stockings, stay active, work toward weight loss, and avoid prolonged sitting or standing.

## 2023-08-17 NOTE — Assessment & Plan Note (Signed)
 The patient expresses a desire to lose weight. She reports that she has been following a heart-healthy diet and would now like to increase her level of physical activity.  A referral will be placed to the PREP program at the [facility] to support her fitness goals.  The patient was encouraged to engage in moderate-intensity exercise at least five days per week, totaling 150 minutes weekly, in combination with continued adherence to a heart-healthy diet.

## 2023-08-17 NOTE — Telephone Encounter (Signed)
 She wanted to get this medication asap so she asked it be sent to [facility]. She did not want it to go to [facility] because she would be delayed getting it

## 2023-08-17 NOTE — Assessment & Plan Note (Signed)

## 2023-08-17 NOTE — Assessment & Plan Note (Signed)
-  Cytology and HPV co-testing (preferred) every 5 years or cytology alone (acceptable) every 3 years. - Pap due 2030

## 2023-08-20 ENCOUNTER — Telehealth: Payer: Self-pay

## 2023-08-20 NOTE — Telephone Encounter (Signed)
 Called  re: PREP program referral, left voicemail requesting return call.

## 2023-08-21 LAB — CYTOLOGY - PAP
Adequacy: ABSENT
Chlamydia: NEGATIVE
Comment: NEGATIVE
Comment: NEGATIVE
Comment: NEGATIVE
Comment: NORMAL
Diagnosis: NEGATIVE
High risk HPV: NEGATIVE
Neisseria Gonorrhea: NEGATIVE
Trichomonas: NEGATIVE

## 2023-08-24 ENCOUNTER — Ambulatory Visit: Payer: Self-pay | Admitting: Family Medicine

## 2023-08-24 DIAGNOSIS — E1165 Type 2 diabetes mellitus with hyperglycemia: Secondary | ICD-10-CM

## 2023-08-24 NOTE — Progress Notes (Signed)
 Please inform the patient: Your Pap examination was negative for intraepithelial lesion or malignancy this means that there were no signs of precancerous changes or cancer were found in the cervical cells sampled during the exam; this is a normal result indicating the cervical cells are healthy and there are no indications of cervical cancer or precancer conditions.

## 2023-08-30 ENCOUNTER — Telehealth: Payer: Self-pay

## 2023-08-30 NOTE — Telephone Encounter (Signed)
 Called re: PREP program referral, left another voicemail requesting return call.

## 2023-09-03 DIAGNOSIS — R7301 Impaired fasting glucose: Secondary | ICD-10-CM | POA: Diagnosis not present

## 2023-09-03 DIAGNOSIS — E038 Other specified hypothyroidism: Secondary | ICD-10-CM | POA: Diagnosis not present

## 2023-09-03 DIAGNOSIS — E7849 Other hyperlipidemia: Secondary | ICD-10-CM | POA: Diagnosis not present

## 2023-09-03 DIAGNOSIS — E559 Vitamin D deficiency, unspecified: Secondary | ICD-10-CM | POA: Diagnosis not present

## 2023-09-03 DIAGNOSIS — Z1159 Encounter for screening for other viral diseases: Secondary | ICD-10-CM | POA: Diagnosis not present

## 2023-09-04 LAB — CBC WITH DIFFERENTIAL/PLATELET
Basophils Absolute: 0.1 x10E3/uL (ref 0.0–0.2)
Basos: 1 %
EOS (ABSOLUTE): 0.2 x10E3/uL (ref 0.0–0.4)
Eos: 2 %
Hematocrit: 41.2 % (ref 34.0–46.6)
Hemoglobin: 13 g/dL (ref 11.1–15.9)
Immature Grans (Abs): 0 x10E3/uL (ref 0.0–0.1)
Immature Granulocytes: 0 %
Lymphocytes Absolute: 2 x10E3/uL (ref 0.7–3.1)
Lymphs: 27 %
MCH: 26.1 pg — ABNORMAL LOW (ref 26.6–33.0)
MCHC: 31.6 g/dL (ref 31.5–35.7)
MCV: 83 fL (ref 79–97)
Monocytes Absolute: 0.4 x10E3/uL (ref 0.1–0.9)
Monocytes: 6 %
Neutrophils Absolute: 4.8 x10E3/uL (ref 1.4–7.0)
Neutrophils: 64 %
Platelets: 230 x10E3/uL (ref 150–450)
RBC: 4.98 x10E6/uL (ref 3.77–5.28)
RDW: 13.9 % (ref 11.7–15.4)
WBC: 7.5 x10E3/uL (ref 3.4–10.8)

## 2023-09-04 LAB — VITAMIN D 25 HYDROXY (VIT D DEFICIENCY, FRACTURES): Vit D, 25-Hydroxy: 26.8 ng/mL — ABNORMAL LOW (ref 30.0–100.0)

## 2023-09-04 LAB — HEPATITIS C ANTIBODY: Hep C Virus Ab: NONREACTIVE

## 2023-09-04 LAB — CMP14+EGFR
ALT: 40 IU/L — ABNORMAL HIGH (ref 0–32)
AST: 23 IU/L (ref 0–40)
Albumin: 4.3 g/dL (ref 3.8–4.9)
Alkaline Phosphatase: 93 IU/L (ref 44–121)
BUN/Creatinine Ratio: 34 — ABNORMAL HIGH (ref 9–23)
BUN: 20 mg/dL (ref 6–24)
Bilirubin Total: 0.3 mg/dL (ref 0.0–1.2)
CO2: 21 mmol/L (ref 20–29)
Calcium: 9.2 mg/dL (ref 8.7–10.2)
Chloride: 105 mmol/L (ref 96–106)
Creatinine, Ser: 0.58 mg/dL (ref 0.57–1.00)
Globulin, Total: 2.5 g/dL (ref 1.5–4.5)
Glucose: 110 mg/dL — ABNORMAL HIGH (ref 70–99)
Potassium: 4.7 mmol/L (ref 3.5–5.2)
Sodium: 142 mmol/L (ref 134–144)
Total Protein: 6.8 g/dL (ref 6.0–8.5)
eGFR: 109 mL/min/1.73 (ref >59)

## 2023-09-04 LAB — LIPID PANEL
Chol/HDL Ratio: 2.8 ratio (ref 0.0–4.4)
Cholesterol, Total: 166 mg/dL (ref 100–199)
HDL: 60 mg/dL (ref >39)
LDL Chol Calc (NIH): 93 mg/dL (ref 0–99)
Triglycerides: 64 mg/dL (ref 0–149)
VLDL Cholesterol Cal: 13 mg/dL (ref 5–40)

## 2023-09-04 LAB — TSH+FREE T4
Free T4: 1.11 ng/dL (ref 0.82–1.77)
TSH: 2.44 u[IU]/mL (ref 0.450–4.500)

## 2023-09-04 LAB — HEMOGLOBIN A1C
Est. average glucose Bld gHb Est-mCnc: 143 mg/dL
Hgb A1c MFr Bld: 6.6 % — ABNORMAL HIGH (ref 4.8–5.6)

## 2023-09-09 MED ORDER — METFORMIN HCL 500 MG PO TABS: 500.0000 mg | ORAL_TABLET | Freq: Every day | ORAL | 3 refills | Status: AC

## 2023-09-14 ENCOUNTER — Other Ambulatory Visit (HOSPITAL_COMMUNITY): Payer: Self-pay

## 2023-09-14 ENCOUNTER — Other Ambulatory Visit: Payer: Self-pay | Admitting: Family Medicine

## 2023-09-14 ENCOUNTER — Ambulatory Visit (HOSPITAL_COMMUNITY)
Admission: RE | Admit: 2023-09-14 | Discharge: 2023-09-14 | Disposition: A | Discharge status: Home / Self Care | Source: Ambulatory Visit | Attending: Family Medicine | Admitting: Family Medicine

## 2023-09-14 DIAGNOSIS — J189 Pneumonia, unspecified organism: Secondary | ICD-10-CM | POA: Diagnosis not present

## 2023-09-14 DIAGNOSIS — R918 Other nonspecific abnormal finding of lung field: Secondary | ICD-10-CM | POA: Diagnosis not present

## 2023-09-14 DIAGNOSIS — I517 Cardiomegaly: Secondary | ICD-10-CM | POA: Diagnosis not present

## 2023-09-21 ENCOUNTER — Ambulatory Visit: Payer: Self-pay | Admitting: Family Medicine

## 2023-09-21 NOTE — Progress Notes (Signed)
 Please inform the patient that her recent chest x-ray indicates that her pneumonia has resolved.

## 2023-11-07 ENCOUNTER — Telehealth: Payer: Self-pay

## 2023-11-07 ENCOUNTER — Telehealth: Payer: Self-pay | Admitting: Family Medicine

## 2023-11-07 NOTE — Telephone Encounter (Signed)
 FMLA forms  Noted Copied Sleeved Original placed in provider box Copy placed front desk folder

## 2023-11-07 NOTE — Telephone Encounter (Signed)
 Melanie Boyd left message for Melanie Boyd forwarded to me. I left a message with her about PREP and asked her to return my call.

## 2023-11-08 NOTE — Telephone Encounter (Signed)
 Placed in [place] box to sign

## 2023-11-12 ENCOUNTER — Telehealth: Payer: Self-pay | Admitting: Family Medicine

## 2023-11-12 NOTE — Telephone Encounter (Signed)
 FMLA forms Noted Copied Sleeved Forms put in provider box Copy placed in front desk folder

## 2023-11-13 NOTE — Telephone Encounter (Signed)
 Placed in providers box.

## 2023-11-21 ENCOUNTER — Telehealth: Payer: Self-pay | Admitting: Orthopedic Surgery

## 2023-11-21 NOTE — Telephone Encounter (Signed)
 Received call from patient. The [employer] request is for her knee. Has appt 10/09. Advised patient to discuss her request for leave with MD. She will call [employer] to have form faxed to me at [phone].

## 2023-11-29 ENCOUNTER — Ambulatory Visit: Admitting: Orthopedic Surgery

## 2023-11-29 ENCOUNTER — Other Ambulatory Visit: Payer: Self-pay

## 2023-11-29 ENCOUNTER — Other Ambulatory Visit (INDEPENDENT_AMBULATORY_CARE_PROVIDER_SITE_OTHER): Payer: Self-pay

## 2023-11-29 ENCOUNTER — Encounter: Payer: Self-pay | Admitting: Orthopedic Surgery

## 2023-11-29 VITALS — Ht 61.0 in | Wt 273.0 lb

## 2023-11-29 DIAGNOSIS — M170 Bilateral primary osteoarthritis of knee: Secondary | ICD-10-CM | POA: Diagnosis not present

## 2023-11-29 MED ORDER — CELECOXIB 100 MG PO CAPS
100.0000 mg | ORAL_CAPSULE | Freq: Two times a day (BID) | ORAL | 0 refills | Status: AC
Start: 2023-11-29 — End: ?

## 2023-11-29 NOTE — Progress Notes (Signed)
    11/29/2023   Chief Complaint  Patient presents with   Knee Pain    Left worse than right     Encounter Diagnosis  Name Primary?   Bilateral primary osteoarthritis of knee Yes    What pharmacy do you use ? ____[place] _______________________  DOI/DOS/ Date:    Worse

## 2023-11-29 NOTE — Patient Instructions (Signed)
 Extend work restrictions for 1 year   8 hrs a day   4 days a week  Do not push carts

## 2023-11-29 NOTE — Progress Notes (Signed)
 Chief Complaint  Patient presents with   Knee Pain    Left worse than right     Melanie Boyd is still having pain in her left knee the right 1 is okay we have not had an x-ray in 3 to 4 years so we will get a new 1  She also went to one of the special f places QC kinetics I believe she just received blood injection probably PRP paid $800 no improvement after 2 weeks  Physical Exam Vitals and nursing note reviewed.  Constitutional:      Appearance: Normal appearance.  HENT:     Head: Normocephalic and atraumatic.  Eyes:     General: No scleral icterus.       Right eye: No discharge.        Left eye: No discharge.     Extraocular Movements: Extraocular movements intact.     Conjunctiva/sclera: Conjunctivae normal.     Pupils: Pupils are equal, round, and reactive to light.  Cardiovascular:     Rate and Rhythm: Normal rate.     Pulses: Normal pulses.  Musculoskeletal:     Comments: Left knee  Skin normal  Medial joint line tenderness  No effusion  Range of motion 0-1 20.  Stability test normal  Skin:    General: Skin is warm and dry.     Capillary Refill: Capillary refill takes less than 2 seconds.  Neurological:     General: No focal deficit present.     Mental Status: She is alert and oriented to person, place, and time.  Psychiatric:        Mood and Affect: Mood normal.        Behavior: Behavior normal.        Thought Content: Thought content normal.        Judgment: Judgment normal.      Repeat imaging shows worsening arthritis of the left knee with varus alignment  Recommend switching to Celebrex  Meds ordered this encounter  Medications   celecoxib (CELEBREX) 100 MG capsule    Sig: Take 1 capsule (100 mg total) by mouth 2 (two) times daily.    Dispense:  60 capsule    Refill:  0

## 2023-12-17 ENCOUNTER — Ambulatory Visit: Admitting: Family Medicine

## 2023-12-20 ENCOUNTER — Encounter: Payer: Self-pay | Admitting: Orthopedic Surgery

## 2023-12-20 ENCOUNTER — Ambulatory Visit: Admitting: Orthopedic Surgery

## 2023-12-20 DIAGNOSIS — M170 Bilateral primary osteoarthritis of knee: Secondary | ICD-10-CM | POA: Diagnosis not present

## 2023-12-20 DIAGNOSIS — K2960 Other gastritis without bleeding: Secondary | ICD-10-CM

## 2023-12-20 MED ORDER — METHYLPREDNISOLONE ACETATE 40 MG/ML IJ SUSP
40.0000 mg | Freq: Once | INTRAMUSCULAR | Status: AC
  Administered 2023-12-20: 40 mg via INTRA_ARTICULAR

## 2023-12-20 MED ORDER — OMEPRAZOLE MAGNESIUM 20 MG PO TBEC
20.0000 mg | DELAYED_RELEASE_TABLET | Freq: Every day | ORAL | 5 refills | Status: AC
Start: 2023-12-20 — End: ?

## 2023-12-20 NOTE — Progress Notes (Signed)
  Joint injections

## 2023-12-20 NOTE — Patient Instructions (Signed)
 Joint Steroid Injection A joint steroid injection is a procedure to relieve swelling and pain in a joint. Steroids are medicines that reduce inflammation. In this procedure, your health care provider uses a syringe and a needle to inject a steroid medicine into a painful and inflamed joint. A pain-relieving medicine (anesthetic) may be injected along with the steroid. In some cases, your health care provider may use an imaging technique such as ultrasound or fluoroscopy to guide the injection. Joints that are often treated with steroid injections include the knee, shoulder, hip, and spine. These injections may also be used in the elbow, ankle, and joints of the hands or feet. You may have joint steroid injections as part of your treatment for inflammation caused by: Gout. Rheumatoid arthritis. Advanced wear-and-tear arthritis (osteoarthritis). Tendinitis. Bursitis. Joint steroid injections may be repeated, but having them too often can damage a joint or the skin over the joint. You should not have joint steroid injections less than 6 weeks apart or more than four times a year. Tell a health care provider about: Any allergies you have. All medicines you are taking, including vitamins, herbs, eye drops, creams, and over-the-counter medicines. Any problems you or family members have had with anesthetic medicines. Any blood disorders you have. Any surgeries you have had. Any medical conditions you have. Whether you are pregnant or may be pregnant. What are the risks? Generally, this is a safe treatment. However, problems may occur, including: Infection. Bleeding. Allergic reactions to medicines. Damage to the joint or tissues around the joint. Thinning of skin or loss of skin color over the joint. Temporary flushing of the face or chest. Temporary increase in pain. Temporary increase in blood sugar. Failure to relieve inflammation or pain. What happens before the treatment? Medicines Ask  your health care provider about: Changing or stopping your regular medicines. This is especially important if you are taking diabetes medicines or blood thinners. Taking medicines such as aspirin and ibuprofen. These medicines can thin your blood. Do not take these medicines unless your health care provider tells you to take them. Taking over-the-counter medicines, vitamins, herbs, and supplements. General instructions You may have imaging tests of your joint. Ask your health care provider if you can drive yourself home after the procedure. What happens during the treatment?  Your health care provider will position you for the injection and locate the injection site over your joint. The skin over the joint will be cleaned with a germ-killing soap. Your health care provider may: Spray a numbing solution (topical anesthetic) over the injection site. Inject a local anesthetic under the skin above your joint. The needle will be placed through your skin into your joint. Your health care provider may use imaging to guide the needle to the right spot for the injection. If imaging is used, a special contrast dye may be injected to confirm that the needle is in the correct location. The steroid medicine will be injected into your joint. Anesthetic may be injected along with the steroid. This may be a medicine that relieves pain for a short time (short-acting anesthetic) or for a longer time (long-acting anesthetic). The needle will be removed, and an adhesive bandage (dressing) will be placed over the injection site. The procedure may vary among health care providers and hospitals. What can I expect after the treatment? You will be able to go home after the treatment. It is normal to feel slight flushing for a few days after the injection. After the treatment, it is  common to have an increase in joint pain after the anesthetic has worn off. This may happen about an hour after a short-acting anesthetic  or about 8 hours after a longer-acting anesthetic. You should begin to feel relief from joint pain and swelling after 24 to 48 hours. Contact your health care provider if you do not begin to feel relief after 2 days. Follow these instructions at home: Injection site care Leave the adhesive dressing over your injection site in place until your health care provider says you can remove it. Check your injection site every day for signs of infection. Check for: More redness, swelling, or pain. Fluid or blood. Warmth. Pus or a bad smell. Activity Return to your normal activities as told by your health care provider. Ask your health care provider what activities are safe for you. You may be asked to limit activities that put stress on the joint for a few days. Do joint exercises as told by your health care provider. Do not take baths, swim, or use a hot tub until your health care provider approves. Ask your health care provider if you may take showers. You may only be allowed to take sponge baths. Managing pain, stiffness, and swelling  If directed, put ice on the joint. To do this: Put ice in a plastic bag. Place a towel between your skin and the bag. Leave the ice on for 20 minutes, 2-3 times a day. Remove the ice if your skin turns bright red. This is very important. If you cannot feel pain, heat, or cold, you have a greater risk of damage to the area. Raise (elevate) your joint above the level of your heart when you are sitting or lying down. General instructions Take over-the-counter and prescription medicines only as told by your health care provider. Do not use any products that contain nicotine or tobacco, such as cigarettes, e-cigarettes, and chewing tobacco. These can delay joint healing. If you need help quitting, ask your health care provider. If you have diabetes, be aware that your blood sugar may be slightly elevated for several days after the injection. Keep all follow-up visits.  This is important. Contact a health care provider if you have: Chills or a fever. Any signs of infection at your injection site. Increased pain or swelling or no relief after 2 days. Summary A joint steroid injection is a treatment to relieve pain and swelling in a joint. Steroids are medicines that reduce inflammation. Your health care provider may add an anesthetic along with the steroid. You may have joint steroid injections as part of your arthritis treatment. Joint steroid injections may be repeated, but having them too often can damage a joint or the skin over the joint. Contact your health care provider if you have a fever, chills, or signs of infection, or if you get no relief from joint pain or swelling. This information is not intended to replace advice given to you by your health care provider. Make sure you discuss any questions you have with your health care provider. Document Revised: 07/18/2019 Document Reviewed: 07/18/2019 [employer]vier Patient Education  2024 [employer].

## 2023-12-20 NOTE — Progress Notes (Signed)
 Chief Complaint  Patient presents with   Injections    Both knees/ [facility]       Taking Celebrex getting some reflux  I will start Prilosec and continue as she needs medication for the arthritis   Pending hyaluronic acid   Procedure note for injection   Chief Complaint  Patient presents with   Injections    Both knees/ [facility]      Encounter Diagnosis  Name Primary?   Bilateral primary osteoarthritis of knee Yes        The patient has consented for injection of the left and right knee joint  Medication: Depo-Medrol  40 mg and lidocaine  1%  Time out completed: Yes  The site of injection was cleaned with alcohol and ethyl chloride.  The injection was given without any complications appropriate precautions were given.

## 2024-01-08 ENCOUNTER — Other Ambulatory Visit: Payer: Self-pay | Admitting: Orthopedic Surgery

## 2024-01-08 DIAGNOSIS — M1710 Unilateral primary osteoarthritis, unspecified knee: Secondary | ICD-10-CM

## 2024-01-08 DIAGNOSIS — G8929 Other chronic pain: Secondary | ICD-10-CM

## 2024-02-29 ENCOUNTER — Telehealth: Payer: Self-pay

## 2024-02-29 NOTE — Telephone Encounter (Signed)
 Asking to transfer to different provider?

## 2024-03-03 NOTE — Telephone Encounter (Signed)
 Yes, okay to transfer to me

## 2024-03-19 ENCOUNTER — Encounter: Admitting: Nurse Practitioner

## 2024-08-20 ENCOUNTER — Encounter
